# Patient Record
Sex: Female | Born: 1980 | Race: White | Hispanic: No | Marital: Single | Smoking: Never smoker
Health system: Southern US, Community
[De-identification: ages and names within clinical notes are randomized; demographics above are authoritative.]

## PROBLEM LIST (undated history)

## (undated) ENCOUNTER — Inpatient Hospital Stay (HOSPITAL_COMMUNITY): Payer: Self-pay

## (undated) DIAGNOSIS — Z789 Other specified health status: Secondary | ICD-10-CM

## (undated) HISTORY — PX: NO PAST SURGERIES: SHX2092

---

## 2010-11-18 ENCOUNTER — Emergency Department: Payer: Self-pay | Admitting: Emergency Medicine

## 2010-11-19 ENCOUNTER — Emergency Department: Payer: Self-pay | Admitting: *Deleted

## 2011-04-15 ENCOUNTER — Emergency Department: Payer: Self-pay | Admitting: Emergency Medicine

## 2011-04-21 ENCOUNTER — Emergency Department: Payer: Self-pay

## 2013-01-09 ENCOUNTER — Emergency Department: Payer: Self-pay | Admitting: Emergency Medicine

## 2013-01-09 LAB — BASIC METABOLIC PANEL
Anion Gap: 7 (ref 7–16)
Calcium, Total: 8.5 mg/dL (ref 8.5–10.1)
Co2: 25 mmol/L (ref 21–32)
EGFR (Non-African Amer.): >60
Osmolality: 272 (ref 275–301)
Potassium: 3.5 mmol/L (ref 3.5–5.1)

## 2013-01-09 LAB — CBC
HCT: 38.9 % (ref 35.0–47.0)
HGB: 12.8 g/dL (ref 12.0–16.0)
MCHC: 32.8 g/dL (ref 32.0–36.0)
MCV: 85 fL (ref 80–100)
Platelet: 374 10*3/uL (ref 150–440)
RDW: 15.5 % — ABNORMAL HIGH (ref 11.5–14.5)
WBC: 6.6 10*3/uL (ref 3.6–11.0)

## 2013-01-09 LAB — TROPONIN I: Troponin-I: <0.02 ng/mL

## 2013-08-27 ENCOUNTER — Emergency Department: Payer: Self-pay | Admitting: Emergency Medicine

## 2013-08-29 ENCOUNTER — Emergency Department: Payer: Self-pay | Admitting: Internal Medicine

## 2013-08-29 LAB — CBC WITH DIFFERENTIAL/PLATELET
BASOS ABS: 0 10*3/uL (ref 0.0–0.1)
BASOS PCT: 0.2 %
EOS PCT: 0.6 %
Eosinophil #: 0.1 10*3/uL (ref 0.0–0.7)
HCT: 42.9 % (ref 35.0–47.0)
HGB: 13.7 g/dL (ref 12.0–16.0)
LYMPHS ABS: 1 10*3/uL (ref 1.0–3.6)
Lymphocyte %: 8 %
MCH: 29.3 pg (ref 26.0–34.0)
MCHC: 31.9 g/dL — ABNORMAL LOW (ref 32.0–36.0)
MCV: 92 fL (ref 80–100)
Monocyte #: 0.6 x10 3/mm (ref 0.2–0.9)
Monocyte %: 4.7 %
NEUTROS PCT: 86.5 %
Neutrophil #: 10.6 10*3/uL — ABNORMAL HIGH (ref 1.4–6.5)
Platelet: 314 10*3/uL (ref 150–440)
RBC: 4.67 10*6/uL (ref 3.80–5.20)
RDW: 14.7 % — AB (ref 11.5–14.5)
WBC: 12.3 10*3/uL — ABNORMAL HIGH (ref 3.6–11.0)

## 2013-08-29 LAB — BASIC METABOLIC PANEL
Anion Gap: 6 — ABNORMAL LOW (ref 7–16)
BUN: 12 mg/dL (ref 7–18)
CO2: 27 mmol/L (ref 21–32)
Calcium, Total: 8.7 mg/dL (ref 8.5–10.1)
Chloride: 102 mmol/L (ref 98–107)
Creatinine: 1.06 mg/dL (ref 0.60–1.30)
EGFR (African American): >60
EGFR (Non-African Amer.): >60
Glucose: 126 mg/dL — ABNORMAL HIGH (ref 65–99)
OSMOLALITY: 271 (ref 275–301)
POTASSIUM: 4.2 mmol/L (ref 3.5–5.1)
Sodium: 135 mmol/L — ABNORMAL LOW (ref 136–145)

## 2013-09-07 ENCOUNTER — Emergency Department: Payer: Self-pay | Admitting: Emergency Medicine

## 2015-04-19 ENCOUNTER — Inpatient Hospital Stay (HOSPITAL_COMMUNITY)
Admission: AD | Admit: 2015-04-19 | Discharge: 2015-04-19 | Disposition: A | Discharge status: Home / Self Care | Payer: 59 | Source: Ambulatory Visit | Attending: Obstetrics & Gynecology | Admitting: Obstetrics & Gynecology

## 2015-04-19 ENCOUNTER — Encounter (HOSPITAL_COMMUNITY): Payer: Self-pay | Admitting: *Deleted

## 2015-04-19 ENCOUNTER — Inpatient Hospital Stay (HOSPITAL_COMMUNITY): Payer: 59

## 2015-04-19 DIAGNOSIS — O209 Hemorrhage in early pregnancy, unspecified: Secondary | ICD-10-CM | POA: Insufficient documentation

## 2015-04-19 DIAGNOSIS — O4691 Antepartum hemorrhage, unspecified, first trimester: Secondary | ICD-10-CM | POA: Diagnosis not present

## 2015-04-19 DIAGNOSIS — Z3A01 Less than 8 weeks gestation of pregnancy: Secondary | ICD-10-CM | POA: Insufficient documentation

## 2015-04-19 HISTORY — DX: Other specified health status: Z78.9

## 2015-04-19 LAB — URINALYSIS, ROUTINE W REFLEX MICROSCOPIC
BILIRUBIN URINE: NEGATIVE
GLUCOSE, UA: NEGATIVE mg/dL
KETONES UR: NEGATIVE mg/dL
Nitrite: NEGATIVE
PH: 6.5 (ref 5.0–8.0)
Protein, ur: NEGATIVE mg/dL
SPECIFIC GRAVITY, URINE: 1.01 (ref 1.005–1.030)

## 2015-04-19 LAB — WET PREP, GENITAL
Sperm: NONE SEEN
TRICH WET PREP: NONE SEEN
Yeast Wet Prep HPF POC: NONE SEEN

## 2015-04-19 LAB — CBC
HEMATOCRIT: 39.1 % (ref 36.0–46.0)
Hemoglobin: 13.6 g/dL (ref 12.0–15.0)
MCH: 30.8 pg (ref 26.0–34.0)
MCHC: 34.8 g/dL (ref 30.0–36.0)
MCV: 88.7 fL (ref 78.0–100.0)
Platelets: 332 10*3/uL (ref 150–400)
RBC: 4.41 MIL/uL (ref 3.87–5.11)
RDW: 13.1 % (ref 11.5–15.5)
WBC: 8.6 10*3/uL (ref 4.0–10.5)

## 2015-04-19 LAB — URINE MICROSCOPIC-ADD ON

## 2015-04-19 LAB — POCT PREGNANCY, URINE: Preg Test, Ur: POSITIVE — AB

## 2015-04-19 LAB — HCG, QUANTITATIVE, PREGNANCY: HCG, BETA CHAIN, QUANT, S: 19701 m[IU]/mL — AB (ref <5)

## 2015-04-19 LAB — ABO/RH: ABO/RH(D): O POS

## 2015-04-19 MED ORDER — METRONIDAZOLE 500 MG PO TABS: 500.0000 mg | ORAL_TABLET | Freq: Two times a day (BID) | ORAL | Status: DC

## 2015-04-19 NOTE — MAU Provider Note (Signed)
History     CSN: [id number]  Arrival date and time: 04/19/15 [id number]   First Provider Initiated Contact with Patient 04/19/15 1002      Chief Complaint  Patient presents with  . Vaginal Bleeding   HPI 35 y.o. G3P0020 at 3420w0d with spotting since last night, no pain. Last intercourse Tuesday night. Denies pain.   Past Medical History  Diagnosis Date  . Medical history non-contributory     Past Surgical History  Procedure Laterality Date  . No past surgeries      History reviewed. No pertinent family history.  Social History  Substance Use Topics  . Smoking status: Never Smoker   . Smokeless tobacco: None  . Alcohol Use: Yes    Allergies:  Allergies  Allergen Reactions  . Sulfa Antibiotics Hives    No prescriptions prior to admission    Review of Systems  Constitutional: Negative.   Respiratory: Negative.   Cardiovascular: Negative.   Gastrointestinal: Negative for nausea, vomiting, abdominal pain, diarrhea and constipation.  Genitourinary: Negative for dysuria, urgency, frequency, hematuria and flank pain.       + bleeding   Musculoskeletal: Negative.   Neurological: Negative.   Psychiatric/Behavioral: Negative.    Physical Exam   Blood pressure 99/80, pulse 79, temperature 98 F (36.7 C), temperature source Oral, resp. rate 16, height 5\' 6"  (1.676 m), weight 174 lb 6.4 oz (79.107 kg), last menstrual period 03/01/2015.  Physical Exam  Constitutional: She is oriented to person, place, and time. She appears well-developed and well-nourished. No distress.  HENT:  Head: Normocephalic and atraumatic.  Cardiovascular: Normal rate, regular rhythm and normal heart sounds.   Respiratory: Effort normal and breath sounds normal. No respiratory distress.  GI: Soft. Bowel sounds are normal. She exhibits no distension and no mass. There is no tenderness. There is no rebound and no guarding.  Genitourinary: There is no rash or lesion on the right labia. There is no  rash or lesion on the left labia. Uterus is not deviated, not enlarged, not fixed and not tender. Cervix exhibits no motion tenderness, no discharge and no friability. Right adnexum displays no mass, no tenderness and no fullness. Left adnexum displays no mass, no tenderness and no fullness. No erythema, tenderness or bleeding in the vagina. Vaginal discharge (brown) found.  Neurological: She is alert and oriented to person, place, and time.  Skin: Skin is warm and dry.  Psychiatric: She has a normal mood and affect.    MAU Course  Procedures Results for orders placed or performed during the hospital encounter of 04/19/15 (from the past 24 hour(s))  Urinalysis, Routine w reflex microscopic (not [facility])     Status: Abnormal   Collection Time: 04/19/15  9:20 AM  Result Value Ref Range   Color, Urine YELLOW YELLOW   APPearance CLEAR CLEAR   Specific Gravity, Urine 1.010 1.005 - 1.030   pH 6.5 5.0 - 8.0   Glucose, UA NEGATIVE NEGATIVE mg/dL   Hgb urine dipstick LARGE (A) NEGATIVE   Bilirubin Urine NEGATIVE NEGATIVE   Ketones, ur NEGATIVE NEGATIVE mg/dL   Protein, ur NEGATIVE NEGATIVE mg/dL   Nitrite NEGATIVE NEGATIVE   Leukocytes, UA SMALL (A) NEGATIVE  Urine microscopic-add on     Status: Abnormal   Collection Time: 04/19/15  9:20 AM  Result Value Ref Range   Squamous Epithelial / LPF 6-30 (A) NONE SEEN   WBC, UA 6-30 0 - 5 WBC/hpf   RBC / HPF 0-5 0 - 5  RBC/hpf   Bacteria, UA FEW (A) NONE SEEN  Pregnancy, urine POC     Status: Abnormal   Collection Time: 04/19/15  9:37 AM  Result Value Ref Range   Preg Test, Ur POSITIVE (A) NEGATIVE  CBC     Status: None   Collection Time: 04/19/15  9:45 AM  Result Value Ref Range   WBC 8.6 4.0 - 10.5 K/uL   RBC 4.41 3.87 - 5.11 MIL/uL   Hemoglobin 13.6 12.0 - 15.0 g/dL   HCT 16.1 09.6 - 04.5 %   MCV 88.7 78.0 - 100.0 fL   MCH 30.8 26.0 - 34.0 pg   MCHC 34.8 30.0 - 36.0 g/dL   RDW 40.9 81.1 - 91.4 %   Platelets 332 150 - 400 K/uL  hCG,  quantitative, pregnancy     Status: Abnormal   Collection Time: 04/19/15  9:54 AM  Result Value Ref Range   hCG, Beta Chain, Quant, S 19701 (H) <5 mIU/mL  ABO/Rh     Status: None (Preliminary result)   Collection Time: 04/19/15  9:54 AM  Result Value Ref Range   ABO/RH(D) O POS   Wet prep, genital     Status: Abnormal   Collection Time: 04/19/15 10:25 AM  Result Value Ref Range   Yeast Wet Prep HPF POC NONE SEEN NONE SEEN   Trich, Wet Prep NONE SEEN NONE SEEN   Clue Cells Wet Prep HPF POC PRESENT (A) NONE SEEN   WBC, Wet Prep HPF POC FEW (A) NONE SEEN   Sperm NONE SEEN     US Ob Comp Less 14 Wks  04/19/2015  CLINICAL DATA:  35 year old pregnant female with vaginal spotting since 3 a.m. EXAM: OBSTETRIC <14 WK [place] AND TRANSVAGINAL OB US TECHNIQUE: Both transabdominal and transvaginal ultrasound examinations were performed for complete evaluation of the gestation as well as the maternal uterus, adnexal regions, and pelvic cul-de-sac. Transvaginal technique was performed to assess early pregnancy. COMPARISON:  No priors. FINDINGS: Intrauterine gestational sac: Single ovoid shaped gestational sac in the fundal portion of the endometrial canal. Yolk sac:  Present. Embryo:  None. Cardiac Activity: None. Heart Rate: N/A MSD: 12  mm   6 w   0  d      [place] EDC: 12/13/2015 Subchorionic hemorrhage: No definite subchorionic hemorrhage. However, there does appear to be a moderate volume of complex fluid in the endometrial canal extending from the gestational sac to the lower uterine segment, concerning for blood. Maternal uterus/adnexae: Bilateral ovaries are normal in appearance. Trace volume of free fluid in the cul-de-sac. IMPRESSION: 1. Probable early IUP with yolk sac, but no fetal pole, or cardiac activity yet visualized. Recommend follow-up quantitative B-HCG levels and follow-up US in 14 days to confirm and assess viability. This recommendation follows SRU consensus guidelines: Diagnostic Criteria for  Nonviable Pregnancy Early in the First Trimester. Malva Limes Med 2013; 782:9562-13. 2. Moderate volume of complex fluid in the endometrial canal presumably reflects blood products. The appearance of this is not typical for subchorionic hemorrhage. 3. Trace volume of free fluid in the cul-de-sac, presumably physiologic. Electronically Signed   By: Trudie Reed M.D.   On: 04/19/2015 11:46   US Ob Transvaginal  04/19/2015  CLINICAL DATA:  35 year old pregnant female with vaginal spotting since 3 a.m. EXAM: OBSTETRIC <14 WK [place] AND TRANSVAGINAL OB US TECHNIQUE: Both transabdominal and transvaginal ultrasound examinations were performed for complete evaluation of the gestation as well as the maternal uterus, adnexal regions, and pelvic  cul-de-sac. Transvaginal technique was performed to assess early pregnancy. COMPARISON:  No priors. FINDINGS: Intrauterine gestational sac: Single ovoid shaped gestational sac in the fundal portion of the endometrial canal. Yolk sac:  Present. Embryo:  None. Cardiac Activity: None. Heart Rate: N/A MSD: 12  mm   6 w   0  d      [place] EDC: 12/13/2015 Subchorionic hemorrhage: No definite subchorionic hemorrhage. However, there does appear to be a moderate volume of complex fluid in the endometrial canal extending from the gestational sac to the lower uterine segment, concerning for blood. Maternal uterus/adnexae: Bilateral ovaries are normal in appearance. Trace volume of free fluid in the cul-de-sac. IMPRESSION: 1. Probable early IUP with yolk sac, but no fetal pole, or cardiac activity yet visualized. Recommend follow-up quantitative B-HCG levels and follow-up US in 14 days to confirm and assess viability. This recommendation follows SRU consensus guidelines: Diagnostic Criteria for Nonviable Pregnancy Early in the First Trimester. Malva Limes Med 2013; 161:0960-45. 2. Moderate volume of complex fluid in the endometrial canal presumably reflects blood products. The appearance of this is not  typical for subchorionic hemorrhage. 3. Trace volume of free fluid in the cul-de-sac, presumably physiologic. Electronically Signed   By: Trudie Reed M.D.   On: 04/19/2015 11:46    Assessment and Plan  35 y.o. G3P0020 at [redacted]w[redacted]d w/ spotting, IUGS w/ yolk sac measuring 6 weeks, likely blood in endometrial canal. Discussed threatened AB w/ patient, plan for repeat quant HCG in 48 hours in [facility] clinic, precautions rev'd. Flagyl for BV.     Medication List    TAKE these medications        metroNIDAZOLE 500 MG tablet  Commonly known as:  FLAGYL  Take 1 tablet (500 mg total) by mouth 2 (two) times daily.            Follow-up Information    Follow up with [facility] On 04/21/2015.   Specialty:  Obstetrics and Gynecology   Why:  11:00 AM for repeat labs   Contact information:   [address] [place] [address] [phone]        Shianna Bally 04/19/2015, 1:59 PM

## 2015-04-19 NOTE — Discharge Instructions (Signed)
Pelvic Rest °Pelvic rest is sometimes recommended for women when:  °· The placenta is partially or completely covering the opening of the cervix (placenta previa). °· There is bleeding between the uterine wall and the amniotic sac in the first trimester (subchorionic hemorrhage). °· The cervix begins to open without labor starting (incompetent cervix, cervical insufficiency). °· The labor is too early (preterm labor). °HOME CARE INSTRUCTIONS °· Do not have sexual intercourse, stimulation, or an orgasm. °· Do not use tampons, douche, or put anything in the vagina. °· Do not lift anything over 10 pounds (4.5 kg). °· Avoid strenuous activity or straining your pelvic muscles. °SEEK MEDICAL CARE IF:  °· You have any vaginal bleeding during pregnancy. Treat this as a potential emergency. °· You have cramping pain felt low in the stomach (stronger than menstrual cramps). °· You notice vaginal discharge (watery, mucus, or bloody). °· You have a low, dull backache. °· There are regular contractions or uterine tightening. °SEEK IMMEDIATE MEDICAL CARE IF: °You have vaginal bleeding and have placenta previa.  °  °This information is not intended to replace advice given to you by your health care provider. Make sure you discuss any questions you have with your health care provider. °  °Document Released: 05/22/2010 Document Revised: 04/19/2011 Document Reviewed: 07/29/2014 °Elsevier Interactive Patient Education ©2016 Elsevier Inc. ° °Vaginal Bleeding During Pregnancy, First Trimester °A small amount of bleeding (spotting) from the vagina is common in early pregnancy. Sometimes the bleeding is normal and is not a problem, and sometimes it is a sign of something serious. Be sure to tell your doctor about any bleeding from your vagina right away. °HOME CARE °· Watch your condition for any changes. °· Follow your doctor's instructions about how active you can be. °· If you are on bed rest: °¨ You may need to stay in bed and only  get up to use the bathroom. °¨ You may be allowed to do some activities. °¨ If you need help, make plans for someone to help you. °· Write down: °¨ The number of pads you use each day. °¨ How often you change pads. °¨ How soaked (saturated) your pads are. °· Do not use tampons. °· Do not douche. °· Do not have sex or orgasms until your doctor says it is okay. °· If you pass any tissue from your vagina, save the tissue so you can show it to your doctor. °· Only take medicines as told by your doctor. °· Do not take aspirin because it can make you bleed. °· Keep all follow-up visits as told by your doctor. °GET HELP IF:  °· You bleed from your vagina. °· You have cramps. °· You have labor pains. °· You have a fever that does not go away after you take medicine. °GET HELP RIGHT AWAY IF:  °· You have very bad cramps in your back or belly (abdomen). °· You pass large clots or tissue from your vagina. °· You bleed more. °· You feel light-headed or weak. °· You pass out (faint). °· You have chills. °· You are leaking fluid or have a gush of fluid from your vagina. °· You pass out while pooping (having a bowel movement). °MAKE SURE YOU: °· Understand these instructions. °· Will watch your condition. °· Will get help right away if you are not doing well or get worse. °  °This information is not intended to replace advice given to you by your health care provider. Make sure you discuss any questions   you have with your health care provider. °  °Document Released: 06/11/2013 Document Reviewed: 06/11/2013 °Elsevier Interactive Patient Education ©2016 Elsevier Inc. ° °

## 2015-04-19 NOTE — Progress Notes (Signed)
History   CSN: [id number]  Arrival date and time: 04/19/15 4098  First Provider Initiated Contact with Patient 04/19/15 1002     Chief Complaint  Patient presents with  . Vaginal Bleeding   HPI  OB History    Gravida Para Term Preterm AB TAB SAB Ectopic Multiple Living   Past Medical History  Diagnosis Date  . Medical history non-contributory     Past Surgical History  Procedure Laterality Date  . No past surgeries      History reviewed. No pertinent family history.  Social History  Substance Use Topics  . Smoking status: Never Smoker   . Smokeless tobacco: None  . Alcohol Use: Yes    Allergies:  Allergies  Allergen Reactions  . Sulfa Antibiotics Hives    No prescriptions prior to admission    Review of Systems  Constitutional: Negative for fever, chills and malaise/fatigue.  HENT: Negative for congestion, nosebleeds and sore throat.   Eyes: Negative for blurred vision and double vision.  Respiratory: Negative for cough, shortness of breath and wheezing.   Cardiovascular: Negative for chest pain, palpitations and leg swelling.  Gastrointestinal: Negative for heartburn, nausea, vomiting, abdominal pain, diarrhea, constipation and blood in stool.  Genitourinary: Positive for hematuria. Negative for dysuria, urgency, frequency and flank pain.  Musculoskeletal: Negative for myalgias, back pain and joint pain.  Skin: Negative for itching and rash.  Neurological: Negative for dizziness, tingling, seizures, loss of consciousness, weakness and headaches.  Endo/Heme/Allergies: Does not bruise/bleed easily.   Physical Exam   Blood pressure 121/66, pulse 84, temperature 98 F (36.7 C), temperature source Oral, resp. rate 16, height  (1.676 m), weight 79.107 kg (174 lb 6.4 oz), last menstrual period 03/01/2015.  Physical Exam  Constitutional: She is oriented to person, place, and time. She appears well-developed and well-nourished. No  distress.  HENT:  Head: Normocephalic and atraumatic.  Neck: Normal range of motion. Neck supple. No thyromegaly present.  Cardiovascular: Normal rate, regular rhythm, normal heart sounds and intact distal pulses.  Exam reveals no gallop and no friction rub.   No murmur heard. Respiratory: Effort normal and breath sounds normal. No respiratory distress. She has no wheezes. She has no rales. She exhibits no tenderness.  GI: Soft. Bowel sounds are normal. There is no tenderness. There is no rebound and no guarding.  Genitourinary: Pelvic exam was performed with patient supine. There is no rash, tenderness, lesion or injury on the right labia. There is no rash, tenderness, lesion or injury on the left labia. Uterus is not tender. Cervix exhibits discharge. Cervix exhibits no friability. Right adnexum displays no mass, no tenderness and no fullness. Left adnexum displays no mass, no tenderness and no fullness. No erythema, tenderness or bleeding in the vagina. No foreign body around the vagina. No signs of injury around the vagina. Vaginal discharge found.    Musculoskeletal: Normal range of motion. She exhibits no edema.  Lymphadenopathy:    She has no cervical adenopathy.  Neurological: She is alert and oriented to person, place, and time.  Skin: Skin is warm. No rash noted. No erythema. No pallor.  Psychiatric: She has a normal mood and affect. Her behavior is normal.    MAU Course  Procedures Overview of pelvic exam procedure explained to patient using teach-back. Patient voiced understanding. Nursing and provider present at bedside for exam. Patient gowned and draped appropriately. Patient placed in  lithotomy position. Using washed and gloved hands, external genitalia were examined. Using a lubricated speculum, the cervix and cervical os were visualized. GC/Chlamydia and vaginal wet prep collected. Speculum removed and disposed of. Bimanual exam was then completed using lubricated fingers.  Patient was then cleaned and wiped appropriately and returned to sitting position. Patient was then provided the opportunity to give feedback on exam technique to provider.  Assessment and Plan  1st Trimester Vaginal Bleeding 1. Urinalysis  2. STD panel, including HIV; discuss results with patient when they become available tomorrow 3. Ultrasound to assess for fetal health and position 4. Work with patient to establish care at local OB/GYN. Patient comments she prefers [facility]. 5. Review signs and symptoms of emergent conditions during pregnancy with patient using teach-back. 6. F/U with OB/GYN 7. Call or visit with questions or concerns in the interim.   Cephus Shellingndrew P Betsey Sossamon 04/19/2015, 10:29 AM

## 2015-04-19 NOTE — MAU Note (Signed)
Pt states the spotting started today around 0300.  Pt states there was a spot in her pants when she got up around 0800.  Pt states is was a similar amount of blood as when you first start you period.  Pt denies pain or cramping.

## 2015-04-20 LAB — HIV ANTIBODY (ROUTINE TESTING W REFLEX): HIV SCREEN 4TH GENERATION: NONREACTIVE

## 2015-04-21 ENCOUNTER — Ambulatory Visit (INDEPENDENT_AMBULATORY_CARE_PROVIDER_SITE_OTHER): Payer: 59 | Admitting: Obstetrics & Gynecology

## 2015-04-21 ENCOUNTER — Telehealth: Payer: Self-pay | Admitting: *Deleted

## 2015-04-21 DIAGNOSIS — O418X10 Other specified disorders of amniotic fluid and membranes, first trimester, not applicable or unspecified: Secondary | ICD-10-CM

## 2015-04-21 DIAGNOSIS — O468X1 Other antepartum hemorrhage, first trimester: Secondary | ICD-10-CM | POA: Diagnosis not present

## 2015-04-21 LAB — GC/CHLAMYDIA PROBE AMP (~~LOC~~) NOT AT ARMC
Chlamydia: NEGATIVE
NEISSERIA GONORRHEA: NEGATIVE

## 2015-04-21 LAB — HCG, QUANTITATIVE, PREGNANCY: HCG, BETA CHAIN, QUANT, S: 34494 m[IU]/mL — AB (ref <5)

## 2015-04-21 NOTE — Progress Notes (Signed)
Ultrasounds Results Note Cc: seen in MAU for spotting SUBJECTIVE HPI:  Ms. Andrea Aguilar is a 35 y.o. G3P0020 at 5440w2d by LMP who presents to the [facility] for followup ultrasound results. The patient denies abdominal pain or vaginal bleeding.  Upon review of the patient's records, patient was first seen in MAU on 3/11 for spotting.   BHCG on that day was approx 19000.  Ultrasound showed IUGS and and on review of images yolk sac is visible but not noted in the report.   Past Medical History  Diagnosis Date  . Medical history non-contributory    Past Surgical History  Procedure Laterality Date  . No past surgeries     Social History   Social History  . Marital Status: Single    Spouse Name: N/A  . Number of Children: N/A  . Years of Education: N/A   Occupational History  . Not on file.   Social History Main Topics  . Smoking status: Never Smoker   . Smokeless tobacco: Not on file  . Alcohol Use: Yes  . Drug Use: No  . Sexual Activity: Not on file   Other Topics Concern  . Not on file   Social History Narrative  . No narrative on file   Current Outpatient Prescriptions on File Prior to Visit  Medication Sig Dispense Refill  . metroNIDAZOLE (FLAGYL) 500 MG tablet Take 1 tablet (500 mg total) by mouth 2 (two) times daily. 14 tablet 0   No current facility-administered medications on file prior to visit.   Allergies  Allergen Reactions  . Sulfa Antibiotics Hives    I have reviewed patient's Past Medical Hx, Surgical Hx, Family Hx, Social Hx, medications and allergies.   Review of Systems  Genitourinary: Negative for vaginal bleeding (bleeding has stopped), vaginal discharge, menstrual problem and pelvic pain.   Review of Systems  Constitutional: Negative for fever and chills.  Gastrointestinal: Negative for nausea, vomiting, abdominal pain, diarrhea and constipation.  Genitourinary: Negative for dysuria.  Musculoskeletal: Negative for back pain.   Neurological: Negative for dizziness and weakness.    Physical Exam  LMP 03/01/2015  GENERAL: Well-developed, well-nourished female in no acute distress.  HEENT: Normocephalic, atraumatic.   LUNGS: Effort normal ABDOMEN: soft, non-tender HEART: Regular rate  SKIN: Warm, dry and without erythema PSYCH: Normal mood and affect NEURO: Alert and oriented x 4  LAB RESULTS No results found for this or any previous visit (from the past 24 hour(s)).  IMAGING [place] Ob Comp Less 14 Wks  04/19/2015  CLINICAL DATA:  35 year old pregnant female with vaginal spotting since 3 a.m. EXAM: OBSTETRIC <14 WK US AND TRANSVAGINAL OB US TECHNIQUE: Both transabdominal and transvaginal ultrasound examinations were performed for complete evaluation of the gestation as well as the maternal uterus, adnexal regions, and pelvic cul-de-sac. Transvaginal technique was performed to assess early pregnancy. COMPARISON:  No priors. FINDINGS: Intrauterine gestational sac: Single ovoid shaped gestational sac in the fundal portion of the endometrial canal. Yolk sac:  Present. Embryo:  None. Cardiac Activity: None. Heart Rate: N/A MSD: 12  mm   6 w   0  d      US EDC: 12/13/2015 Subchorionic hemorrhage: No definite subchorionic hemorrhage. However, there does appear to be a moderate volume of complex fluid in the endometrial canal extending from the gestational sac to the lower uterine segment, concerning for blood. Maternal uterus/adnexae: Bilateral ovaries are normal in appearance. Trace volume of free fluid in the cul-de-sac. IMPRESSION: 1. Probable  early IUP with yolk sac, but no fetal pole, or cardiac activity yet visualized. Recommend follow-up quantitative B-HCG levels and follow-up US in 14 days to confirm and assess viability. This recommendation follows SRU consensus guidelines: Diagnostic Criteria for Nonviable Pregnancy Early in the First Trimester. Malva Limes Med 2013; 119:1478-29. 2. Moderate volume of complex fluid in the  endometrial canal presumably reflects blood products. The appearance of this is not typical for subchorionic hemorrhage. 3. Trace volume of free fluid in the cul-de-sac, presumably physiologic. Electronically Signed   By: Trudie Reed M.D.   On: 04/19/2015 11:46   US Ob Transvaginal  04/19/2015  CLINICAL DATA:  35 year old pregnant female with vaginal spotting since 3 a.m. EXAM: OBSTETRIC <14 WK [place] AND TRANSVAGINAL OB US TECHNIQUE: Both transabdominal and transvaginal ultrasound examinations were performed for complete evaluation of the gestation as well as the maternal uterus, adnexal regions, and pelvic cul-de-sac. Transvaginal technique was performed to assess early pregnancy. COMPARISON:  No priors. FINDINGS: Intrauterine gestational sac: Single ovoid shaped gestational sac in the fundal portion of the endometrial canal. Yolk sac:  Present. Embryo:  None. Cardiac Activity: None. Heart Rate: N/A MSD: 12  mm   6 w   0  d      [place] EDC: 12/13/2015 Subchorionic hemorrhage: No definite subchorionic hemorrhage. However, there does appear to be a moderate volume of complex fluid in the endometrial canal extending from the gestational sac to the lower uterine segment, concerning for blood. Maternal uterus/adnexae: Bilateral ovaries are normal in appearance. Trace volume of free fluid in the cul-de-sac. IMPRESSION: 1. Probable early IUP with yolk sac, but no fetal pole, or cardiac activity yet visualized. Recommend follow-up quantitative B-HCG levels and follow-up US in 14 days to confirm and assess viability. This recommendation follows SRU consensus guidelines: Diagnostic Criteria for Nonviable Pregnancy Early in the First Trimester. Malva Limes Med 2013; 562:1308-65. 2. Moderate volume of complex fluid in the endometrial canal presumably reflects blood products. The appearance of this is not typical for subchorionic hemorrhage. 3. Trace volume of free fluid in the cul-de-sac, presumably physiologic. Electronically  Signed   By: Trudie Reed M.D.   On: 04/19/2015 11:46   I reviewed the images and a yolk sac is visible making a clear diagnosis of 5-6 week IUP Quant HCG sent today is pending ASSESSMENT 1. Subchorionic bleed, first trimester     PLAN Discharge home in stable condition Patient advised to start/continue taking prenatal vitamins We will notify her of HCG result  Patient advised to start prenatal care with [facility] provider of choice as soon as possible. She has appt in [place] in 2 weeks but has moved to [facility] [place]  04/21/2015  1:35 PM

## 2015-04-21 NOTE — Patient Instructions (Signed)
First Trimester of Pregnancy The first trimester of pregnancy is from week 1 until the end of week 12 (months 1 through 3). A week after a sperm fertilizes an egg, the egg will implant on the wall of the uterus. This embryo will begin to develop into a baby. Genes from you and your partner are forming the baby. The female genes determine whether the baby is a boy or a girl. At 6-8 weeks, the eyes and face are formed, and the heartbeat can be seen on ultrasound. At the end of 12 weeks, all the baby's organs are formed.  Now that you are pregnant, you will want to do everything you can to have a healthy baby. Two of the most important things are to get good prenatal care and to follow your health care provider's instructions. Prenatal care is all the medical care you receive before the baby's birth. This care will help prevent, find, and treat any problems during the pregnancy and childbirth. BODY CHANGES Your body goes through many changes during pregnancy. The changes vary from woman to woman.   You may gain or lose a couple of pounds at first.  You may feel sick to your stomach (nauseous) and throw up (vomit). If the vomiting is uncontrollable, call your health care provider.  You may tire easily.  You may develop headaches that can be relieved by medicines approved by your health care provider.  You may urinate more often. Painful urination may mean you have a bladder infection.  You may develop heartburn as a result of your pregnancy.  You may develop constipation because certain hormones are causing the muscles that push waste through your intestines to slow down.  You may develop hemorrhoids or swollen, bulging veins (varicose veins).  Your breasts may begin to grow larger and become tender. Your nipples may stick out more, and the tissue that surrounds them (areola) may become darker.  Your gums may bleed and may be sensitive to brushing and flossing.  Dark spots or blotches (chloasma,  mask of pregnancy) may develop on your face. This will likely fade after the baby is born.  Your menstrual periods will stop.  You may have a loss of appetite.  You may develop cravings for certain kinds of food.  You may have changes in your emotions from day to day, such as being excited to be pregnant or being concerned that something may go wrong with the pregnancy and baby.  You may have more vivid and strange dreams.  You may have changes in your hair. These can include thickening of your hair, rapid growth, and changes in texture. Some women also have hair loss during or after pregnancy, or hair that feels dry or thin. Your hair will most likely return to normal after your baby is born. WHAT TO EXPECT AT YOUR PRENATAL VISITS During a routine prenatal visit:  You will be weighed to make sure you and the baby are growing normally.  Your blood pressure will be taken.  Your abdomen will be measured to track your baby's growth.  The fetal heartbeat will be listened to starting around week 10 or 12 of your pregnancy.  Test results from any previous visits will be discussed. Your health care provider may ask you:  How you are feeling.  If you are feeling the baby move.  If you have had any abnormal symptoms, such as leaking fluid, bleeding, severe headaches, or abdominal cramping.  If you are using any tobacco products,   including cigarettes, chewing tobacco, and electronic cigarettes.  If you have any questions. Other tests that may be performed during your first trimester include:  Blood tests to find your blood type and to check for the presence of any previous infections. They will also be used to check for low iron levels (anemia) and Rh antibodies. Later in the pregnancy, blood tests for diabetes will be done along with other tests if problems develop.  Urine tests to check for infections, diabetes, or protein in the urine.  An ultrasound to confirm the proper growth  and development of the baby.  An amniocentesis to check for possible genetic problems.  Fetal screens for spina bifida and Down syndrome.  You may need other tests to make sure you and the baby are doing well.  HIV (human immunodeficiency virus) testing. Routine prenatal testing includes screening for HIV, unless you choose not to have this test. HOME CARE INSTRUCTIONS  Medicines  Follow your health care provider's instructions regarding medicine use. Specific medicines may be either safe or unsafe to take during pregnancy.  Take your prenatal vitamins as directed.  If you develop constipation, try taking a stool softener if your health care provider approves. Diet  Eat regular, well-balanced meals. Choose a variety of foods, such as meat or vegetable-based protein, fish, milk and low-fat dairy products, vegetables, fruits, and whole grain breads and cereals. Your health care provider will help you determine the amount of weight gain that is right for you.  Avoid raw meat and uncooked cheese. These carry germs that can cause birth defects in the baby.  Eating four or five small meals rather than three large meals a day may help relieve nausea and vomiting. If you start to feel nauseous, eating a few soda crackers can be helpful. Drinking liquids between meals instead of during meals also seems to help nausea and vomiting.  If you develop constipation, eat more high-fiber foods, such as fresh vegetables or fruit and whole grains. Drink enough fluids to keep your urine clear or pale yellow. Activity and Exercise  Exercise only as directed by your health care provider. Exercising will help you:  Control your weight.  Stay in shape.  Be prepared for labor and delivery.  Experiencing pain or cramping in the lower abdomen or low back is a good sign that you should stop exercising. Check with your health care provider before continuing normal exercises.  Try to avoid standing for long  periods of time. Move your legs often if you must stand in one place for a long time.  Avoid heavy lifting.  Wear low-heeled shoes, and practice good posture.  You may continue to have sex unless your health care provider directs you otherwise. Relief of Pain or Discomfort  Wear a good support bra for breast tenderness.   Take warm sitz baths to soothe any pain or discomfort caused by hemorrhoids. Use hemorrhoid cream if your health care provider approves.   Rest with your legs elevated if you have leg cramps or low back pain.  If you develop varicose veins in your legs, wear support hose. Elevate your feet for 15 minutes, 3-4 times a day. Limit salt in your diet. Prenatal Care  Schedule your prenatal visits by the twelfth week of pregnancy. They are usually scheduled monthly at first, then more often in the last 2 months before delivery.  Write down your questions. Take them to your prenatal visits.  Keep all your prenatal visits as directed by your   health care provider. Safety  Wear your seat belt at all times when driving.  Make a list of emergency phone numbers, including numbers for family, friends, the hospital, and police and fire departments. General Tips  Ask your health care provider for a referral to a local prenatal education class. Begin classes no later than at the beginning of month 6 of your pregnancy.  Ask for help if you have counseling or nutritional needs during pregnancy. Your health care provider can offer advice or refer you to specialists for help with various needs.  Do not use hot tubs, steam rooms, or saunas.  Do not douche or use tampons or scented sanitary pads.  Do not cross your legs for long periods of time.  Avoid cat litter boxes and soil used by cats. These carry germs that can cause birth defects in the baby and possibly loss of the fetus by miscarriage or stillbirth.  Avoid all smoking, herbs, alcohol, and medicines not prescribed by  your health care provider. Chemicals in these affect the formation and growth of the baby.  Do not use any tobacco products, including cigarettes, chewing tobacco, and electronic cigarettes. If you need help quitting, ask your health care provider. You may receive counseling support and other resources to help you quit.  Schedule a dentist appointment. At home, brush your teeth with a soft toothbrush and be gentle when you floss. SEEK MEDICAL CARE IF:   You have dizziness.  You have mild pelvic cramps, pelvic pressure, or nagging pain in the abdominal area.  You have persistent nausea, vomiting, or diarrhea.  You have a bad smelling vaginal discharge.  You have pain with urination.  You notice increased swelling in your face, hands, legs, or ankles. SEEK IMMEDIATE MEDICAL CARE IF:   You have a fever.  You are leaking fluid from your vagina.  You have spotting or bleeding from your vagina.  You have severe abdominal cramping or pain.  You have rapid weight gain or loss.  You vomit blood or material that looks like coffee grounds.  You are exposed to German measles and have never had them.  You are exposed to fifth disease or chickenpox.  You develop a severe headache.  You have shortness of breath.  You have any kind of trauma, such as from a fall or a car accident.   This information is not intended to replace advice given to you by your health care provider. Make sure you discuss any questions you have with your health care provider.   Document Released: 01/19/2001 Document Revised: 02/15/2014 Document Reviewed: 12/05/2012 Elsevier Interactive Patient Education 2016 Else[employer] Inc.  

## 2015-04-21 NOTE — Telephone Encounter (Signed)
Called Andrea Aguilar with results of her bhcg today after review by Dr. Debroah LoopArnold.Informed her level has risen well and is good results.  Instructed her to keep her New ob appt she has already scheduled with provider in [place]. She voices understanding.

## 2015-04-23 ENCOUNTER — Inpatient Hospital Stay (HOSPITAL_COMMUNITY): Payer: 59

## 2015-04-23 ENCOUNTER — Encounter (HOSPITAL_COMMUNITY): Payer: Self-pay | Admitting: *Deleted

## 2015-04-23 ENCOUNTER — Inpatient Hospital Stay (HOSPITAL_COMMUNITY)
Admission: AD | Admit: 2015-04-23 | Discharge: 2015-04-23 | Disposition: A | Discharge status: Home / Self Care | Payer: 59 | Source: Ambulatory Visit | Attending: Obstetrics & Gynecology | Admitting: Obstetrics & Gynecology

## 2015-04-23 DIAGNOSIS — O208 Other hemorrhage in early pregnancy: Secondary | ICD-10-CM | POA: Insufficient documentation

## 2015-04-23 DIAGNOSIS — O468X1 Other antepartum hemorrhage, first trimester: Secondary | ICD-10-CM

## 2015-04-23 DIAGNOSIS — R109 Unspecified abdominal pain: Secondary | ICD-10-CM | POA: Diagnosis present

## 2015-04-23 DIAGNOSIS — O26851 Spotting complicating pregnancy, first trimester: Secondary | ICD-10-CM

## 2015-04-23 DIAGNOSIS — Z3A01 Less than 8 weeks gestation of pregnancy: Secondary | ICD-10-CM | POA: Insufficient documentation

## 2015-04-23 DIAGNOSIS — O418X10 Other specified disorders of amniotic fluid and membranes, first trimester, not applicable or unspecified: Secondary | ICD-10-CM

## 2015-04-23 NOTE — Discharge Instructions (Signed)
Pelvic Rest °Pelvic rest is sometimes recommended for women when:  °· The placenta is partially or completely covering the opening of the cervix (placenta previa). °· There is bleeding between the uterine wall and the amniotic sac in the first trimester (subchorionic hemorrhage). °· The cervix begins to open without labor starting (incompetent cervix, cervical insufficiency). °· The labor is too early (preterm labor). °HOME CARE INSTRUCTIONS °· Do not have sexual intercourse, stimulation, or an orgasm. °· Do not use tampons, douche, or put anything in the vagina. °· Do not lift anything over 10 pounds (4.5 kg). °· Avoid strenuous activity or straining your pelvic muscles. °SEEK MEDICAL CARE IF:  °· You have any vaginal bleeding during pregnancy. Treat this as a potential emergency. °· You have cramping pain felt low in the stomach (stronger than menstrual cramps). °· You notice vaginal discharge (watery, mucus, or bloody). °· You have a low, dull backache. °· There are regular contractions or uterine tightening. °SEEK IMMEDIATE MEDICAL CARE IF: °You have vaginal bleeding and have placenta previa.  °  °This information is not intended to replace advice given to you by your health care provider. Make sure you discuss any questions you have with your health care provider. °  °Document Released: 05/22/2010 Document Revised: 04/19/2011 Document Reviewed: 07/29/2014 °Elsevier Interactive Patient Education ©2016 [employer] Inc[employer] °Subchorionic Hematoma °A subchorionic hematoma is a gathering of blood between the outer wall of the placenta and the inner wall of the womb (uterus). The placenta is the organ that connects the fetus to the wall of the uterus. The placenta performs the feeding, breathing (oxygen to the fetus), and waste removal (excretory work) of the fetus.  °Subchorionic hematoma is the most common abnormality found on a result from ultrasonography done during the first trimester or early second trimester of  pregnancy. If there has been little or no vaginal bleeding, early small hematomas usually shrink on their own and do not affect your baby or pregnancy. The blood is gradually absorbed over 1-2 weeks. When bleeding starts later in pregnancy or the hematoma is larger or occurs in an older pregnant woman, the outcome may not be as good. Larger hematomas may get bigger, which increases the chances for miscarriage. Subchorionic hematoma also increases the risk of premature detachment of the placenta from the uterus, preterm (premature) labor, and stillbirth. °HOME CARE INSTRUCTIONS °· Stay on bed rest if your health care provider recommends this. Although bed rest will not prevent more bleeding or prevent a miscarriage, your health care provider may recommend bed rest until you are advised otherwise. °· Avoid heavy lifting (more than 10 lb [4.5 kg]), exercise, sexual intercourse, or douching as directed by your health care provider. °· Keep track of the number of pads you use each day and how soaked (saturated) they are. Write down this information. °· Do not use tampons. °· Keep all follow-up appointments as directed by your health care provider. Your health care provider may ask you to have follow-up blood tests or ultrasound tests or both. °SEEK IMMEDIATE MEDICAL CARE IF: °· You have severe cramps in your stomach, back, abdomen, or pelvis. °· You have a fever. °· You pass large clots or tissue. Save any tissue for your health care provider to look at. °· Your bleeding increases or you become lightheaded, feel weak, or have fainting episodes. °  °This information is not intended to replace advice given to you by your health care provider. Make sure you discuss any questions you have with   your health care provider. °  °Document Released: 05/12/2006 Document Revised: 02/15/2014 Document Reviewed: 08/24/2012 °Elsevier Interactive Patient Education ©2016 Elsevier Inc. ° °

## 2015-04-23 NOTE — MAU Provider Note (Signed)
History     CSN: [id number]  Arrival date and time: 04/23/15 0422   First Provider Initiated Contact with Patient 04/23/15 [phone]      Chief Complaint  Patient presents with  . Abdominal Cramping   HPI Ms. Andrea Aguilar is a 35 y.o. G3P0020 at [redacted]w[redacted]d who presents to MAU today with complaint of spotting. The patient was seen in MAU on 3/10 with the same complaint. [place] at that time showed IUGS and YS without FP or cardiac activity. Patient states that tonight while at work she was more active and noted a slight increased in spotting. She still has needed no more than a panty liner. At that time she had mild cramping as well, but denies pain now. She also denies UTI symptoms or fever.   OB History    Gravida Para Term Preterm AB TAB SAB Ectopic Multiple Living   Past Medical History  Diagnosis Date  . Medical history non-contributory     Past Surgical History  Procedure Laterality Date  . No past surgeries      History reviewed. No pertinent family history.  Social History  Substance Use Topics  . Smoking status: Never Smoker   . Smokeless tobacco: None  . Alcohol Use: Yes     Comment: NONE  X1 YEAR    Allergies:  Allergies  Allergen Reactions  . Sulfa Antibiotics Hives    Prescriptions prior to admission  Medication Sig Dispense Refill Last Dose  . metroNIDAZOLE (FLAGYL) 500 MG tablet Take 1 tablet (500 mg total) by mouth 2 (two) times daily. 14 tablet 0 04/22/2015 at Unknown time    Review of Systems  Constitutional: Negative for fever and malaise/fatigue.  Gastrointestinal: Positive for abdominal pain.  Genitourinary: Negative for dysuria, urgency and frequency.       + spotting Neg - vaginal discharge   Physical Exam   Blood pressure 118/75, pulse 83, temperature 97.8 F (36.6 C), temperature source Oral, resp. rate 18, height  (1.651 m), weight 180 lb 8 oz (81.874 kg), last menstrual period 03/01/2015.  Physical Exam  Nursing  note and vitals reviewed. Constitutional: She is oriented to person, place, and time. She appears well-developed and well-nourished. No distress.  HENT:  Head: Normocephalic and atraumatic.  Cardiovascular: Normal rate.   Respiratory: Effort normal.  GI: Soft. She exhibits no distension and no mass. There is no tenderness. There is no rebound and no guarding.  Genitourinary: Uterus is not enlarged and not tender. Cervix exhibits no motion tenderness, no discharge and no friability. Right adnexum displays no mass and no tenderness. Left adnexum displays no mass and no tenderness. There is bleeding (scant light brown) in the vagina. Vaginal discharge (small amount of light brown discharge) found.  Neurological: She is alert and oriented to person, place, and time.  Skin: Skin is warm and dry. No erythema.  Psychiatric: She has a normal mood and affect.  Dilation: Closed Effacement (%): Thick Cervical Position: Middle Exam by:: Zannie Kehr, PA-C  US Ob Transvaginal  04/23/2015  CLINICAL DATA:  Spotting in first-trimester pregnancy. EXAM: TRANSVAGINAL OB ULTRASOUND TECHNIQUE: Transvaginal ultrasound was performed for complete evaluation of the gestation as well as the maternal uterus, adnexal regions, and pelvic cul-de-sac. COMPARISON:  04/19/2015 FINDINGS: Intrauterine gestational sac: Visualized/normal in shape. Yolk sac:  Now seen Embryo:  Now seen Cardiac Activity: Present Heart Rate: 105 bpm CRL:   2.1  mm   5 w 5 d                  US EDC: 12/19/2015 Subchorionic hemorrhage: Small areas of subchorionic hemorrhage noted, measuring up to 9 by 5 mm. Maternal uterus/adnexae: Physiologic appearance of the adnexa. IMPRESSION: 1. Single living intrauterine pregnancy measuring 5 weeks 5 days. 2. Small subchorionic hemorrhage. Electronically Signed   By: Marnee SpringJonathon  Watts M.D.   On: 04/23/2015 05:18     MAU Course  Procedures None  MDM Reviewed results and notes from 3/10. Patient had IUGS and YS  without SCH. BV was diagnosed and treated with Flagyl. She was spotting then as well.  US ordered  Assessment and Plan  A: SIUP at 5210w5d Small Subchorionic Hemorrhage  P: Discharge home Continued Flagyl as previously prescribed Bleeding precautions discussed Patient advised to follow-up with OB in [place] as scheduled for routine prenatal care List of area OB providers given as [place] [facility] [place] [facility] [place] Patient may return to MAU as needed or if her condition were to change or worsen   Marny LowensteinJulie N Nyx Keady, PA-C  04/23/2015, 5:30 AM

## 2015-04-23 NOTE — MAU Note (Addendum)
PT  SAYS SHE WAS HERE ON SAT  AND Monday  FOR  LABS.    SPOTTING  STARTED   TONIGHT AT 12 MN-  ON P[place]YLINER.     CRAMPING STARTED 0200-   LESS  NOW.   LAST SEX-  LAST WED.  ALL HAPPENED  WHILE  AT  WORK.

## 2015-05-15 ENCOUNTER — Inpatient Hospital Stay (HOSPITAL_COMMUNITY)
Admission: AD | Admit: 2015-05-15 | Discharge: 2015-05-15 | Disposition: A | Discharge status: Home / Self Care | Payer: 59 | Source: Ambulatory Visit | Attending: Obstetrics and Gynecology | Admitting: Obstetrics and Gynecology

## 2015-05-15 ENCOUNTER — Encounter (HOSPITAL_COMMUNITY): Payer: Self-pay | Admitting: *Deleted

## 2015-05-15 DIAGNOSIS — O26851 Spotting complicating pregnancy, first trimester: Secondary | ICD-10-CM | POA: Diagnosis not present

## 2015-05-15 DIAGNOSIS — O468X1 Other antepartum hemorrhage, first trimester: Secondary | ICD-10-CM

## 2015-05-15 DIAGNOSIS — O26891 Other specified pregnancy related conditions, first trimester: Secondary | ICD-10-CM | POA: Diagnosis not present

## 2015-05-15 DIAGNOSIS — N938 Other specified abnormal uterine and vaginal bleeding: Secondary | ICD-10-CM | POA: Diagnosis not present

## 2015-05-15 DIAGNOSIS — Z3A09 9 weeks gestation of pregnancy: Secondary | ICD-10-CM | POA: Diagnosis not present

## 2015-05-15 DIAGNOSIS — O418X10 Other specified disorders of amniotic fluid and membranes, first trimester, not applicable or unspecified: Secondary | ICD-10-CM

## 2015-05-15 NOTE — Discharge Instructions (Signed)
Subchorionic Hematoma °A subchorionic hematoma is a gathering of blood between the outer wall of the placenta and the inner wall of the womb (uterus). The placenta is the organ that connects the fetus to the wall of the uterus. The placenta performs the feeding, breathing (oxygen to the fetus), and waste removal (excretory work) of the fetus.  °Subchorionic hematoma is the most common abnormality found on a result from ultrasonography done during the first trimester or early second trimester of pregnancy. If there has been little or no vaginal bleeding, early small hematomas usually shrink on their own and do not affect your baby or pregnancy. The blood is gradually absorbed over 1-2 weeks. When bleeding starts later in pregnancy or the hematoma is larger or occurs in an older pregnant woman, the outcome may not be as good. Larger hematomas may get bigger, which increases the chances for miscarriage. Subchorionic hematoma also increases the risk of premature detachment of the placenta from the uterus, preterm (premature) labor, and stillbirth. °HOME CARE INSTRUCTIONS °· Stay on bed rest if your health care provider recommends this. Although bed rest will not prevent more bleeding or prevent a miscarriage, your health care provider may recommend bed rest until you are advised otherwise. °· Avoid heavy lifting (more than 10 lb [4.5 kg]), exercise, sexual intercourse, or douching as directed by your health care provider. °· Keep track of the number of pads you use each day and how soaked (saturated) they are. Write down this information. °· Do not use tampons. °· Keep all follow-up appointments as directed by your health care provider. Your health care provider may ask you to have follow-up blood tests or ultrasound tests or both. °SEEK IMMEDIATE MEDICAL CARE IF: °· You have severe cramps in your stomach, back, abdomen, or pelvis. °· You have a fever. °· You pass large clots or tissue. Save any tissue for your health  care provider to look at. °· Your bleeding increases or you become lightheaded, feel weak, or have fainting episodes. °  °This information is not intended to replace advice given to you by your health care provider. Make sure you discuss any questions you have with your health care provider. °  °Document Released: 05/12/2006 Document Revised: 02/15/2014 Document Reviewed: 08/24/2012 °Elsevier Interactive Patient Education ©2016 [employer] Inc. ° °

## 2015-05-15 NOTE — MAU Note (Signed)
Pt reports she began spotting about an hour ago and then passed a clot. Also reports cramping started about one hour before the spotting started.

## 2015-05-15 NOTE — MAU Provider Note (Signed)
History     CSN: [id number]  Arrival date and time: 05/15/15 0247   First Provider Initiated Contact with Patient 05/15/15 [phone]      Chief Complaint  Patient presents with  . Vaginal Bleeding   HPI Comments: Andrea Aguilar is a 35 y.o. G3P0020 at 4139w6d who presents today with vaginal bleeding. She states that she has been spotting off and on since the beginning of her pregnancy. She states that tonight while at work she passed a clot about the size of a dime. She has had intermittent cramping.   Patient had US with documented IUP on 04/23/15. Small SCH noted on that exam.   Vaginal Bleeding The patient's primary symptoms include vaginal bleeding. This is a new problem. The current episode started today. The problem occurs intermittently. The problem has been unchanged. Pain severity now: 3/10  The problem affects both sides. She is pregnant. Associated symptoms include abdominal pain. Pertinent negatives include no chills, constipation, diarrhea, dysuria, fever, frequency, nausea, urgency or vomiting. The vaginal discharge was bloody. The vaginal bleeding is spotting. She has been passing clots (x1). She has not been passing tissue. Nothing aggravates the symptoms. She has tried nothing for the symptoms. Sexual activity: no recent intercourse  Menstrual history: LMP 03/01/15     Past Medical History  Diagnosis Date  . Medical history non-contributory     Past Surgical History  Procedure Laterality Date  . No past surgeries      History reviewed. No pertinent family history.  Social History  Substance Use Topics  . Smoking status: Never Smoker   . Smokeless tobacco: None  . Alcohol Use: Yes     Comment: NONE  X1 YEAR    Allergies:  Allergies  Allergen Reactions  . Sulfa Antibiotics Hives    Prescriptions prior to admission  Medication Sig Dispense Refill Last Dose  . metroNIDAZOLE (FLAGYL) 500 MG tablet Take 1 tablet (500 mg total) by mouth 2 (two) times daily. 14 tablet  0 04/22/2015 at Unknown time    Review of Systems  Constitutional: Negative for fever and chills.  Gastrointestinal: Positive for abdominal pain. Negative for nausea, vomiting, diarrhea and constipation.  Genitourinary: Positive for vaginal bleeding. Negative for dysuria, urgency and frequency.   Physical Exam   Blood pressure 112/71, pulse 72, resp. rate 18, height 5\' 5"  (1.651 m), weight 82.101 kg (181 lb), last menstrual period 03/01/2015, SpO2 100 %.  Physical Exam  Nursing note and vitals reviewed. Constitutional: She is oriented to person, place, and time. She appears well-developed and well-nourished. No distress.  HENT:  Head: Normocephalic.  Cardiovascular: Normal rate.   Respiratory: Effort normal.  GI: Soft. There is no tenderness. There is no rebound.  Neurological: She is alert and oriented to person, place, and time.  Skin: Skin is warm and dry.  Psychiatric: She has a normal mood and affect.    MAU Course  Procedures  MDM Bedside US shows fetus with CRL of 9 weeks 1 day, this is consistent with EDC we have on file from last US. Cardiac activity noted.   Assessment and Plan   1. Subchorionic hematoma in first trimester   2. Spotting affecting pregnancy in first trimester    DC home Comfort measures reviewed  1st Trimester precautions  Bleeding precautions RX: none  Return to MAU as needed FU with OB as planned  Follow-up Information    Please follow up.   Contact information:   The OB of your choice when  you arrive in [place].         Tawnya Crook 05/15/2015, 3:19 AM

## 2015-05-17 ENCOUNTER — Inpatient Hospital Stay (HOSPITAL_COMMUNITY): Payer: 59

## 2015-05-17 ENCOUNTER — Encounter (HOSPITAL_COMMUNITY): Payer: Self-pay

## 2015-05-17 ENCOUNTER — Inpatient Hospital Stay (HOSPITAL_COMMUNITY)
Admission: AD | Admit: 2015-05-17 | Discharge: 2015-05-17 | Disposition: A | Discharge status: Home / Self Care | Payer: 59 | Source: Ambulatory Visit | Attending: Obstetrics & Gynecology | Admitting: Obstetrics & Gynecology

## 2015-05-17 DIAGNOSIS — Z3A09 9 weeks gestation of pregnancy: Secondary | ICD-10-CM | POA: Diagnosis not present

## 2015-05-17 DIAGNOSIS — O209 Hemorrhage in early pregnancy, unspecified: Secondary | ICD-10-CM | POA: Diagnosis not present

## 2015-05-17 DIAGNOSIS — O4691 Antepartum hemorrhage, unspecified, first trimester: Secondary | ICD-10-CM

## 2015-05-17 LAB — URINE MICROSCOPIC-ADD ON

## 2015-05-17 LAB — URINALYSIS, ROUTINE W REFLEX MICROSCOPIC
BILIRUBIN URINE: NEGATIVE
GLUCOSE, UA: NEGATIVE mg/dL
KETONES UR: NEGATIVE mg/dL
Leukocytes, UA: NEGATIVE
NITRITE: NEGATIVE
PH: 5.5 (ref 5.0–8.0)
Protein, ur: NEGATIVE mg/dL
Specific Gravity, Urine: 1.01 (ref 1.005–1.030)

## 2015-05-17 NOTE — Discharge Instructions (Signed)
Pelvic Rest °Pelvic rest is sometimes recommended for women when:  °· The placenta is partially or completely covering the opening of the cervix (placenta previa). °· There is bleeding between the uterine wall and the amniotic sac in the first trimester (subchorionic hemorrhage). °· The cervix begins to open without labor starting (incompetent cervix, cervical insufficiency). °· The labor is too early (preterm labor). °HOME CARE INSTRUCTIONS °· Do not have sexual intercourse, stimulation, or an orgasm. °· Do not use tampons, douche, or put anything in the vagina. °· Do not lift anything over 10 pounds (4.5 kg). °· Avoid strenuous activity or straining your pelvic muscles. °SEEK MEDICAL CARE IF:  °· You have any vaginal bleeding during pregnancy. Treat this as a potential emergency. °· You have cramping pain felt low in the stomach (stronger than menstrual cramps). °· You notice vaginal discharge (watery, mucus, or bloody). °· You have a low, dull backache. °· There are regular contractions or uterine tightening. °SEEK IMMEDIATE MEDICAL CARE IF: °You have vaginal bleeding and have placenta previa.  °  °This information is not intended to replace advice given to you by your health care provider. Make sure you discuss any questions you have with your health care provider. °  °Document Released: 05/22/2010 Document Revised: 04/19/2011 Document Reviewed: 07/29/2014 °Elsevier Interactive Patient Education ©2016 Elsevier Inc. ° °Vaginal Bleeding During Pregnancy, First Trimester °A small amount of bleeding (spotting) from the vagina is common in early pregnancy. Sometimes the bleeding is normal and is not a problem, and sometimes it is a sign of something serious. Be sure to tell your doctor about any bleeding from your vagina right away. °HOME CARE °· Watch your condition for any changes. °· Follow your doctor's instructions about how active you can be. °· If you are on bed rest: °¨ You may need to stay in bed and only  get up to use the bathroom. °¨ You may be allowed to do some activities. °¨ If you need help, make plans for someone to help you. °· Write down: °¨ The number of pads you use each day. °¨ How often you change pads. °¨ How soaked (saturated) your pads are. °· Do not use tampons. °· Do not douche. °· Do not have sex or orgasms until your doctor says it is okay. °· If you pass any tissue from your vagina, save the tissue so you can show it to your doctor. °· Only take medicines as told by your doctor. °· Do not take aspirin because it can make you bleed. °· Keep all follow-up visits as told by your doctor. °GET HELP IF:  °· You bleed from your vagina. °· You have cramps. °· You have labor pains. °· You have a fever that does not go away after you take medicine. °GET HELP RIGHT AWAY IF:  °· You have very bad cramps in your back or belly (abdomen). °· You pass large clots or tissue from your vagina. °· You bleed more. °· You feel light-headed or weak. °· You pass out (faint). °· You have chills. °· You are leaking fluid or have a gush of fluid from your vagina. °· You pass out while pooping (having a bowel movement). °MAKE SURE YOU: °· Understand these instructions. °· Will watch your condition. °· Will get help right away if you are not doing well or get worse. °  °This information is not intended to replace advice given to you by your health care provider. Make sure you discuss any questions   you have with your health care provider. °  °Document Released: 06/11/2013 Document Reviewed: 06/11/2013 °Elsevier Interactive Patient Education ©2016 Elsevier Inc. ° °

## 2015-05-17 NOTE — MAU Note (Signed)
Started bleeding heavy around 1130.  Is passing clots, quarter sized.  Having back pain, off and on today.

## 2015-05-17 NOTE — MAU Provider Note (Signed)
History     CSN: [id number]  Arrival date and time: 05/17/15 1541   First Provider Initiated Contact with Patient 05/17/15 1622      Chief Complaint  Patient presents with  . Vaginal Bleeding  . Back Pain   HPI Pt is 35 yo white G3P0020 @ [redacted]w[redacted]d pregnant.  Pt was seen with positive pregnancy test on 04/19/2015; US showed [redacted]w[redacted]d IUGS with YS with mod volume of complex fluid in endometrial canal.  Pt was seen again on 04/23/2015 with spotting in pregnancy and had [place] with IUGS, YS and CA measuring [redacted]w[redacted]d with small SCH. Pt seen on 05/15/2015 with vaginal bleeding with clot passed and intermittent cramping.  Bedside ultrasound Showed CRL [redacted]w[redacted]d with CA. Pt's bleeding today has increased with small - mod sized clot- pt has been through 3 pantiliners in 6 hours. Pt is not cramping  RN note:  Expand All Collapse All   Started bleeding heavy around 1130. Is passing clots, quarter sized. Having back pain, off and on today.       Past Medical History  Diagnosis Date  . Medical history non-contributory     Past Surgical History  Procedure Laterality Date  . No past surgeries      No family history on file.  Social History  Substance Use Topics  . Smoking status: Never Smoker   . Smokeless tobacco: None  . Alcohol Use: Yes     Comment: NONE  X1 YEAR    Allergies:  Allergies  Allergen Reactions  . Sulfa Antibiotics Hives    Prescriptions prior to admission  Medication Sig Dispense Refill Last Dose  . acetaminophen (TYLENOL) 500 MG tablet Take 1,000 mg by mouth every 6 (six) hours as needed for mild pain or headache.   05/17/2015 at 1400  . Prenatal Vit-Fe Fumarate-FA (PRENATAL MULTIVITAMIN) TABS tablet Take 1 tablet by mouth daily.   05/17/2015 at Unknown time  . metroNIDAZOLE (FLAGYL) 500 MG tablet Take 1 tablet (500 mg total) by mouth 2 (two) times daily. (Patient not taking: Reported on 05/17/2015) 14 tablet 0 04/22/2015 at Unknown time    ROS Physical Exam   Blood pressure  113/68, pulse 82, temperature 98.1 F (36.7 C), temperature source Oral, resp. rate 18, weight 183 lb 12.8 oz (83.371 kg), last menstrual period 03/01/2015.  Physical Exam  Nursing note and vitals reviewed. Constitutional: She appears well-developed and well-nourished. No distress.  HENT:  Head: Normocephalic.  Eyes: Pupils are equal, round, and reactive to light.  Neck: Normal range of motion.  Cardiovascular: Normal rate.   Respiratory: Effort normal.  GI: Soft.  Musculoskeletal: Normal range of motion.  Neurological: She is alert.  Skin: Skin is warm and dry.  Psychiatric: She has a normal mood and affect.    MAU Course  Procedures US Ob Transvaginal  05/17/2015  CLINICAL DATA:  Bleeding, [redacted] weeks pregnant. EXAM: TRANSVAGINAL OB ULTRASOUND TECHNIQUE: Transvaginal ultrasound was performed for complete evaluation of the gestation as well as the maternal uterus, adnexal regions, and pelvic cul-de-sac. COMPARISON:  None. FINDINGS: Intrauterine gestational sac: Present Yolk sac:  Normal in appearance Embryo:  Present Cardiac Activity: Present Heart Rate: 152 bpm MSD:   mm    w     d CRL:   27  mm   9 w 4 d                  [place] EDC: 12/16/2015 Subchorionic hemorrhage: Curvilinear hypoechoic collection adjacent to the gestational sac is  compatible with a subchorionic hemorrhage, measuring approximately 2.9 x 1.1 cm. Maternal uterus/adnexae: Maternal right ovary appears normal containing small follicles. No mass or free fluid within the adjacent right adnexal region. Maternal left ovary is not seen but there is no mass or free fluid identified in the left adnexal region. No free fluid seen within the cul-de-sac. IMPRESSION: 1. Single live intrauterine pregnancy with estimated gestational age of [redacted] weeks and 4 days by crown-rump length measurement. Fetal heart rate measured at 152 beats per minute. 2. Subchorionic hemorrhage adjacent to the gestational sac, measuring approximately 2.9 x 1.1 cm although  difficult to definitively measure due to its curvilinear configuration. 3. Maternal adnexal regions are unremarkable. Electronically Signed   By: Bary RichardStan  Maynard M.D.   On: 05/17/2015 17:27   Reviewed US results with pt  Assessment and Plan  [facility] Viable IUP 3271w4d [place]- return sooner if increase in bleeding  Andrea Aguilar 05/17/2015, 4:40 PM

## 2016-02-22 ENCOUNTER — Encounter (HOSPITAL_COMMUNITY): Payer: Self-pay

## 2016-11-09 IMAGING — US US OB COMP LESS 14 WK
1 series · 15 of 28 positions shown · non-contrast
Comparison: No priors.

CLINICAL DATA: 34-year-old pregnant female with vaginal spotting
since 3 a.m..

EXAM:
OBSTETRIC <14 WK US AND TRANSVAGINAL OB US
TECHNIQUE: Both transabdominal and transvaginal ultrasound examinations were
performed for complete evaluation of the gestation as well as the
maternal uterus, adnexal regions, and pelvic cul-de-sac.
Transvaginal technique was performed to assess early pregnancy.

[Series 1: us ob comp less 14 wk · 15 of 42 slices shown]
[im 1/42]
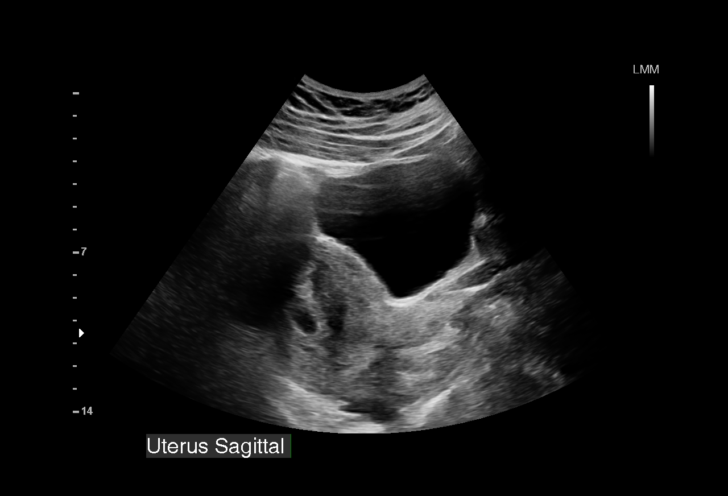
[im 4/42]
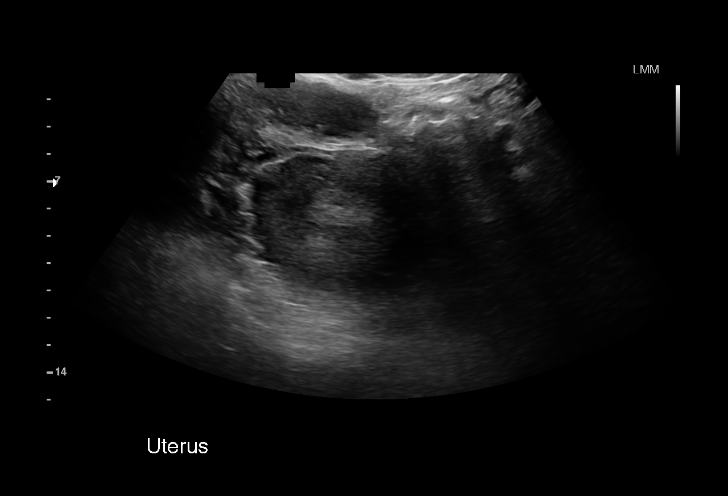
[im 7/42]
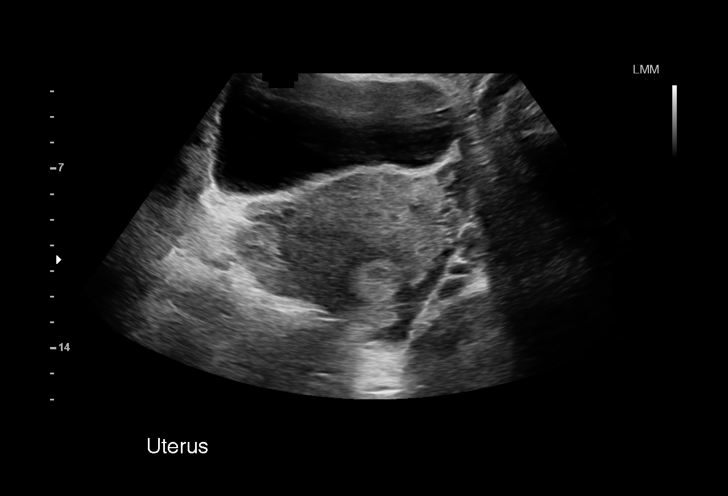
[im 10/42]
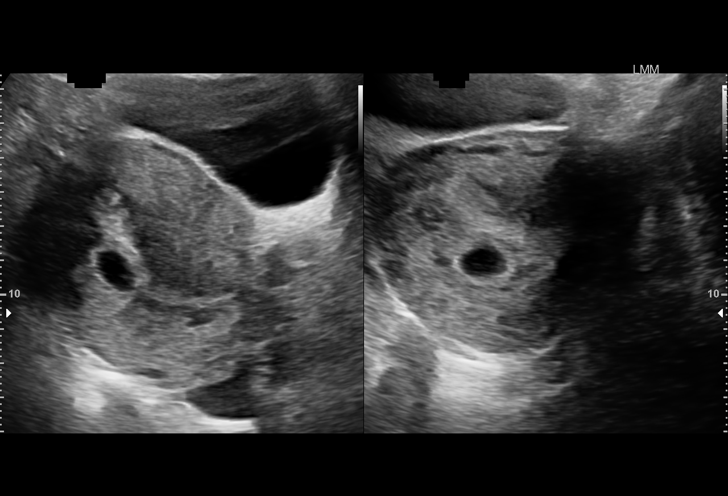
[im 13/42]
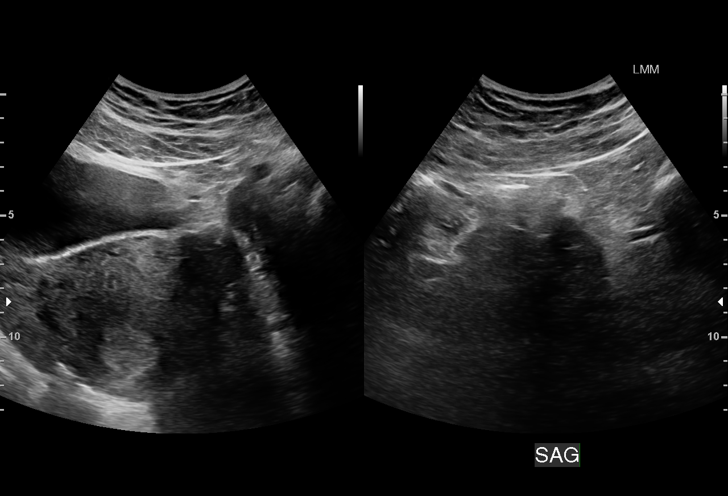
[im 16/42]
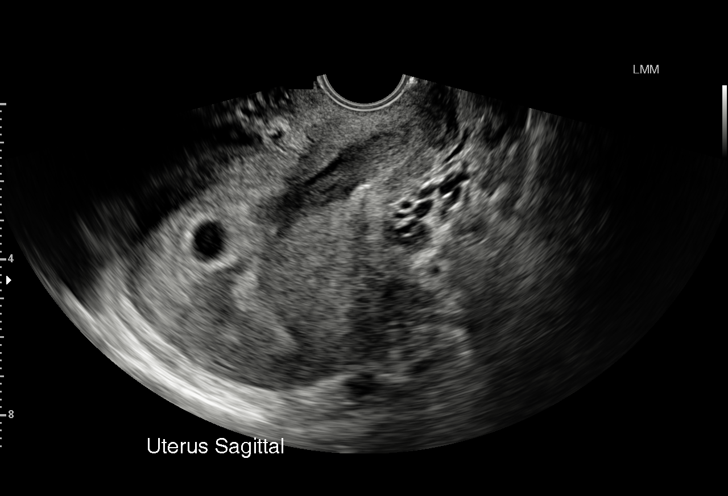
[im 19/42]
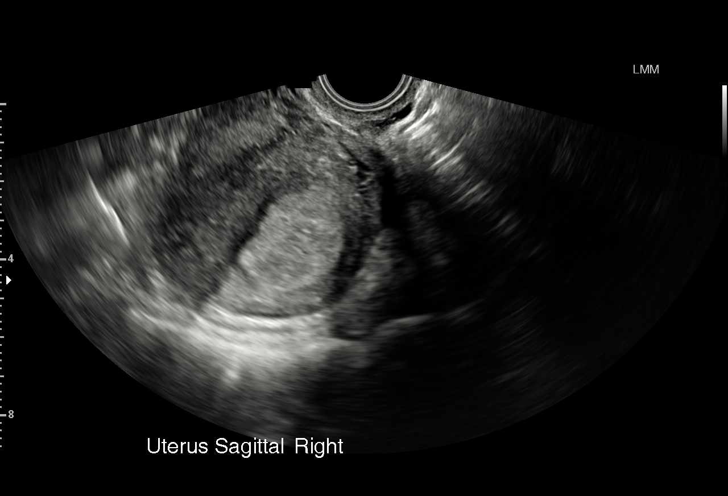
[im 22/42]
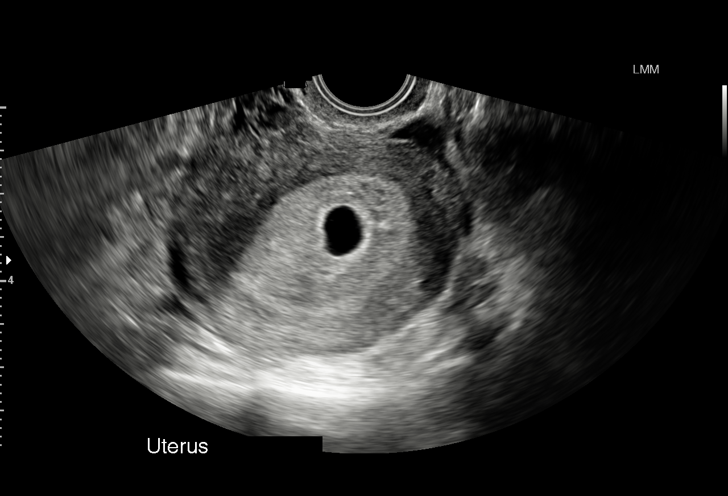
[im 23/42]
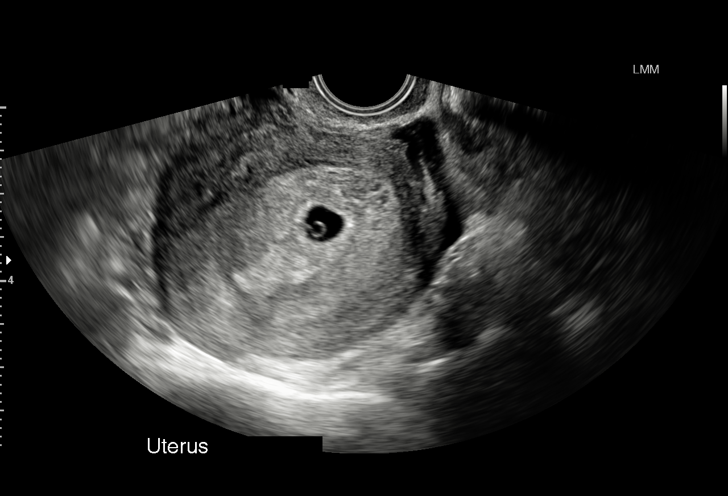
[im 26/42]
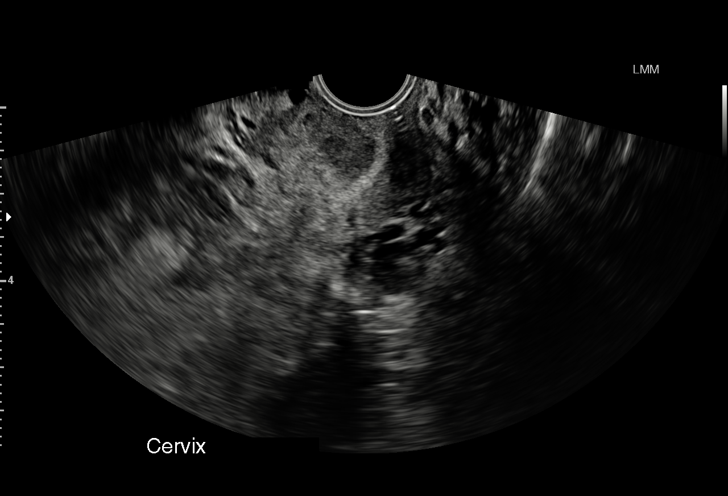
[im 29/42]
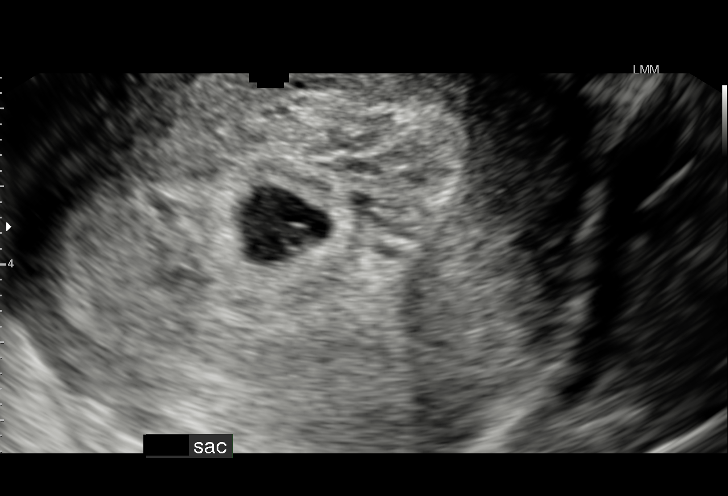
[im 32/42]
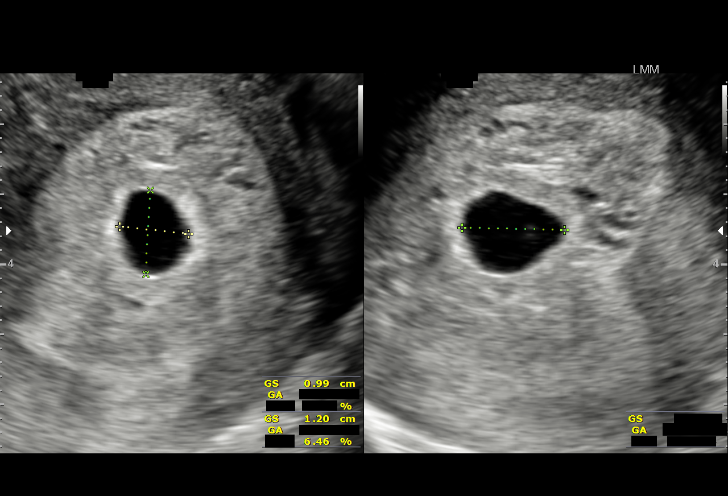
[im 35/42]
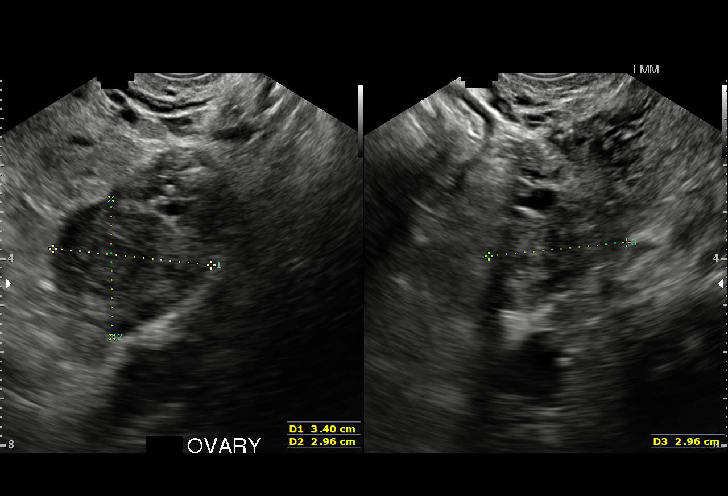
[im 38/42]
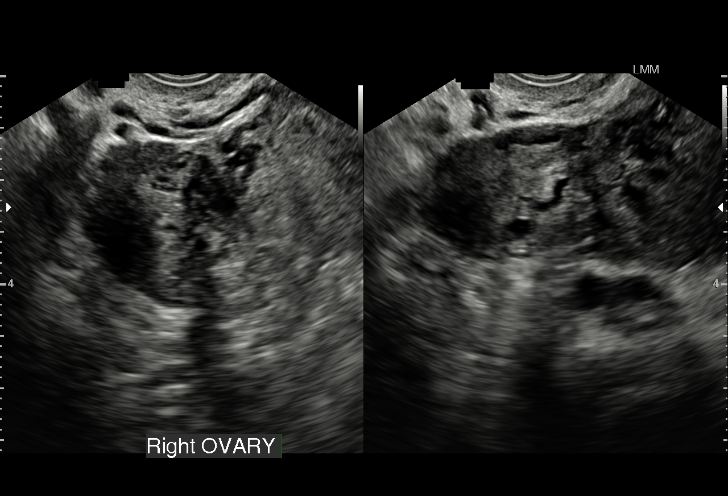
[im 42/42]
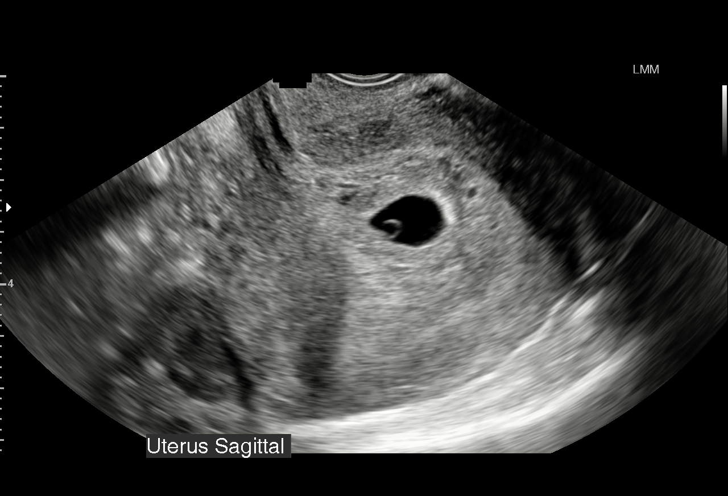

[15 of 28 positions shown; findings below may reference images not displayed]

FINDINGS: Intrauterine gestational sac: Single ovoid shaped gestational sac in
the fundal portion of the endometrial canal.

Yolk sac:  Present.

Embryo:  None.

Cardiac Activity: None.

Heart Rate: N/A

MSD: 12  mm   6 w   0  d      US EDC: 12/13/2015

Subchorionic hemorrhage: No definite subchorionic hemorrhage.
However, there does appear to be a moderate volume of complex fluid
in the endometrial canal extending from the gestational sac to the
lower uterine segment, concerning for blood.

Maternal uterus/adnexae: Bilateral ovaries are normal in appearance.
Trace volume of free fluid in the cul-de-sac.
IMPRESSION: 1. Probable early IUP with yolk sac, but no fetal pole, or cardiac
activity yet visualized. Recommend follow-up quantitative B-HCG
levels and follow-up US in 14 days to confirm and assess viability.
This recommendation follows SRU consensus guidelines: Diagnostic
Criteria for Nonviable Pregnancy Early in the First Trimester. N
Engl J Med 8778; [DATE].
2. Moderate volume of complex fluid in the endometrial canal
presumably reflects blood products. The appearance of this is not
typical for subchorionic hemorrhage.
3. Trace volume of free fluid in the cul-de-sac, presumably
physiologic.

## 2016-12-07 IMAGING — US US OB TRANSVAGINAL
1 series · 15 of 22 positions shown · non-contrast
Comparison: None.

CLINICAL DATA: Bleeding, 9 weeks pregnant.

EXAM:
TRANSVAGINAL OB ULTRASOUND
TECHNIQUE: Transvaginal ultrasound was performed for complete evaluation of the
gestation as well as the maternal uterus, adnexal regions, and
pelvic cul-de-sac.

[Series 1: us ob transvaginal · 15 of 22 slices shown]
[im 1/22]
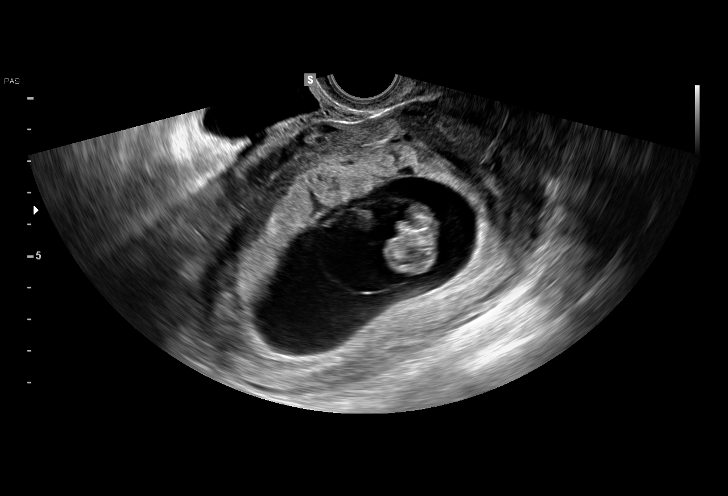
[im 3/22]
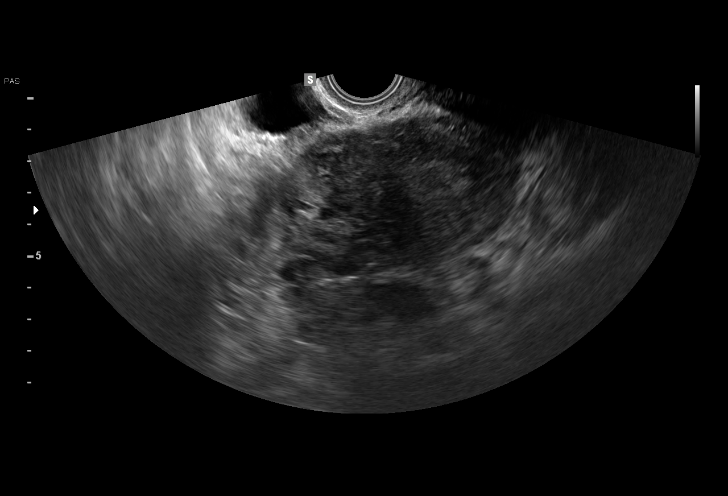
[im 4/22]
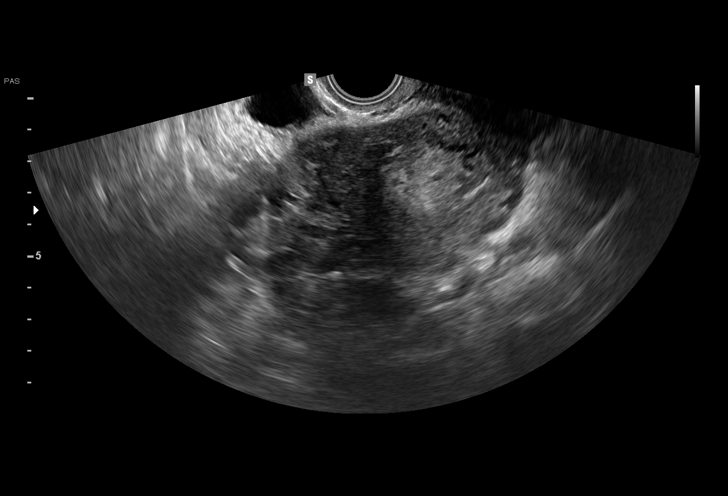
[im 6/22]
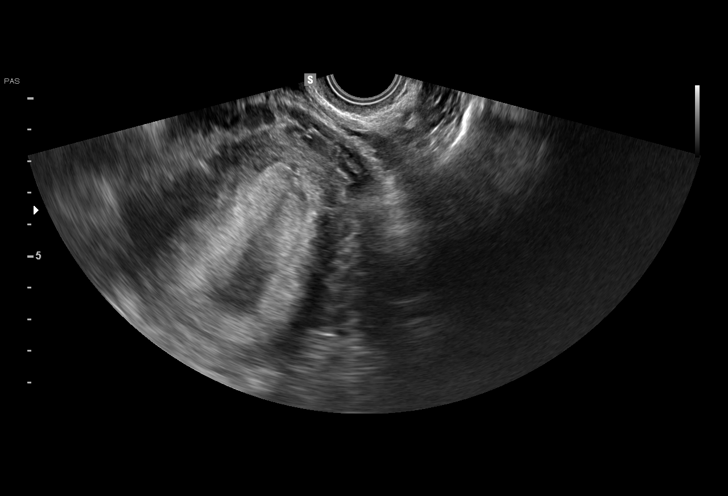
[im 7/22]
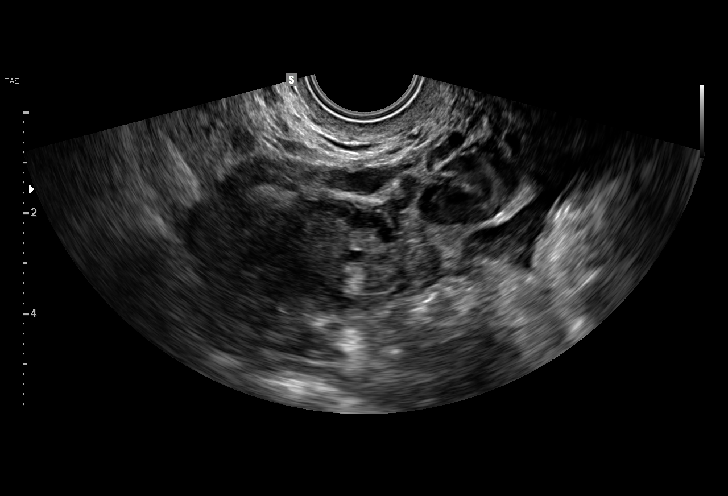
[im 9/22]
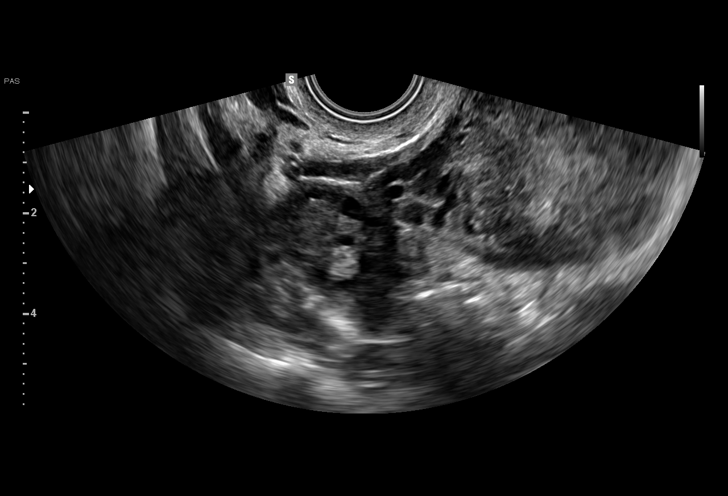
[im 10/22]
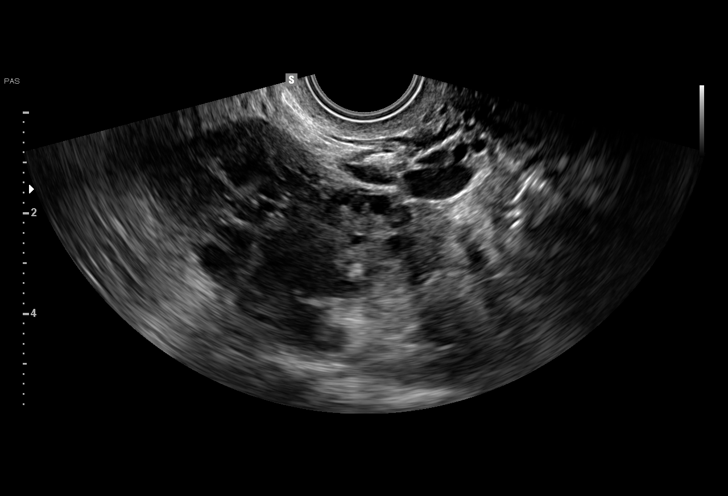
[im 12/22]
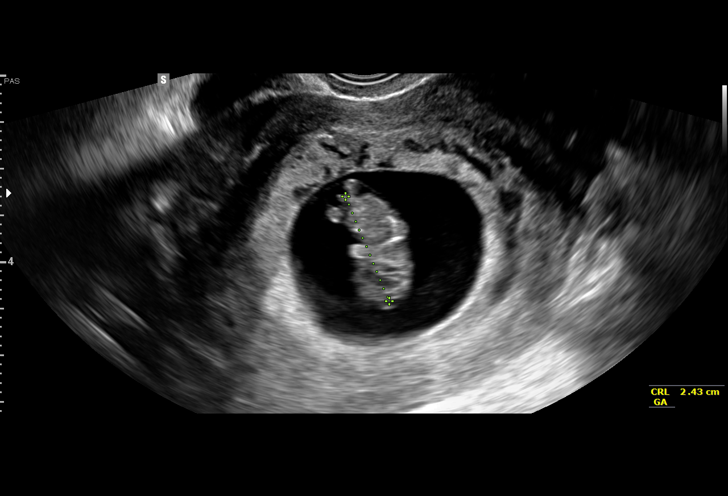
[im 13/22]
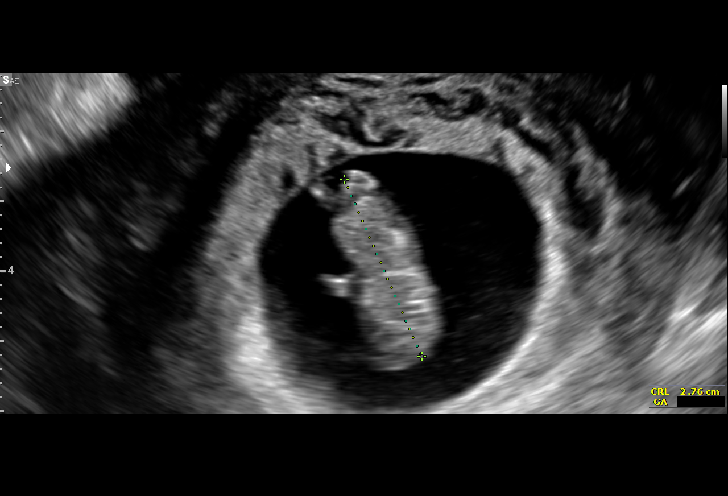
[im 14/22]
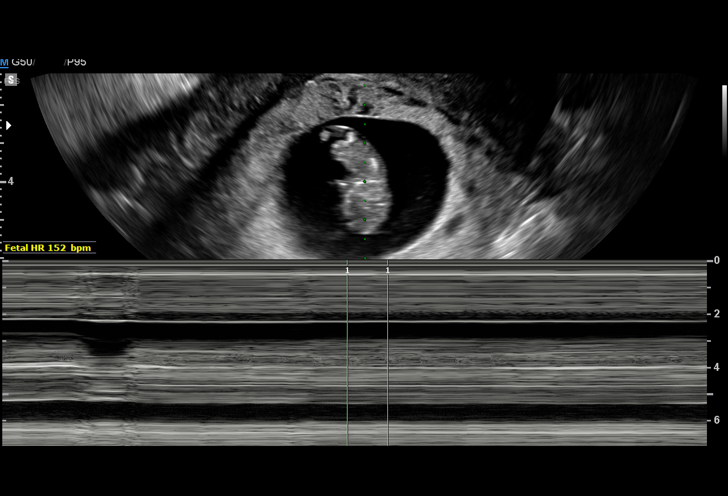
[im 16/22]
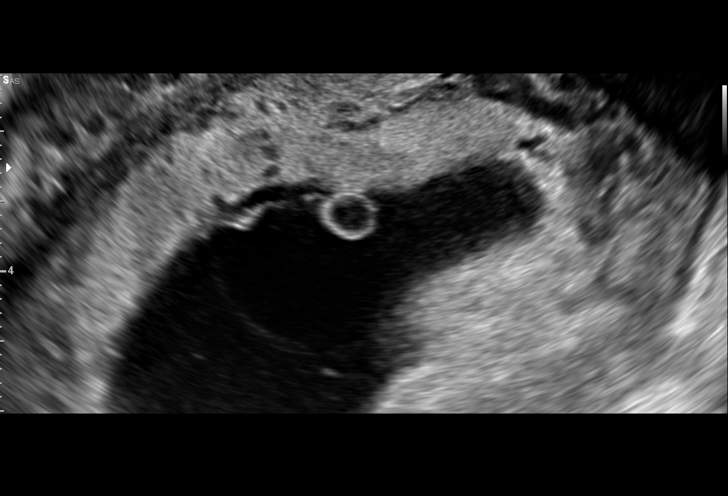
[im 17/22]
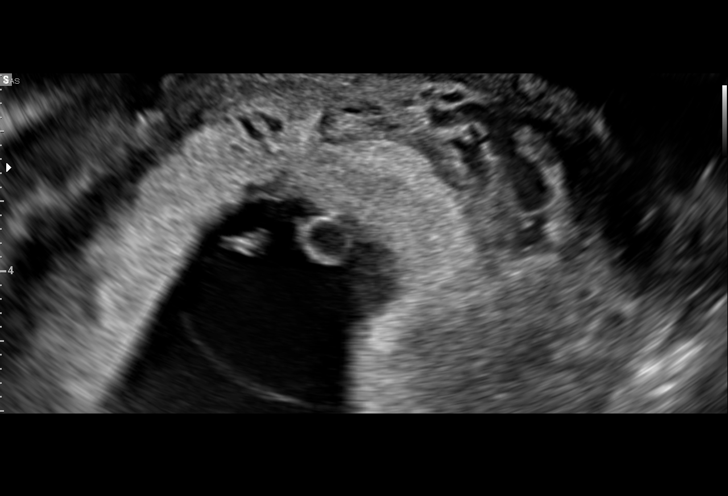
[im 19/22]
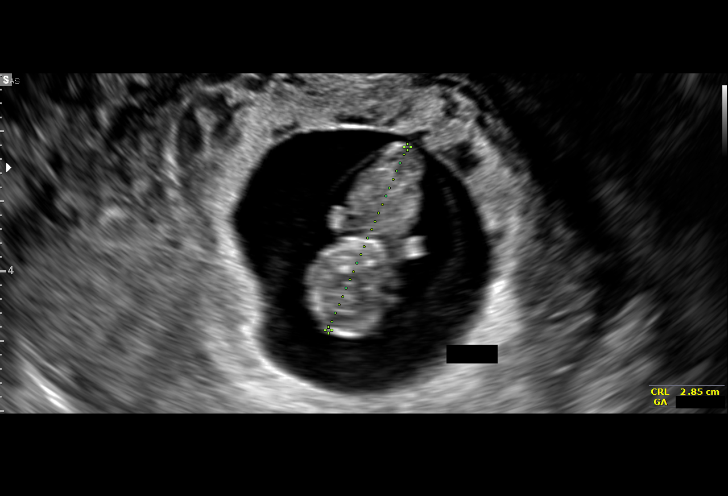
[im 20/22]
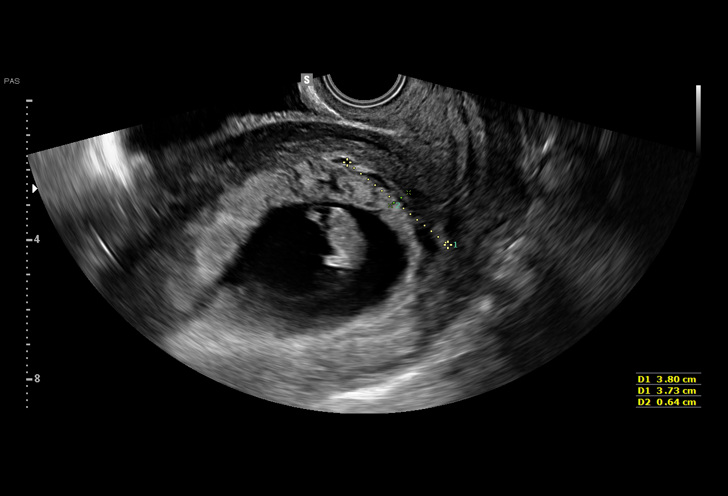
[im 22/22]
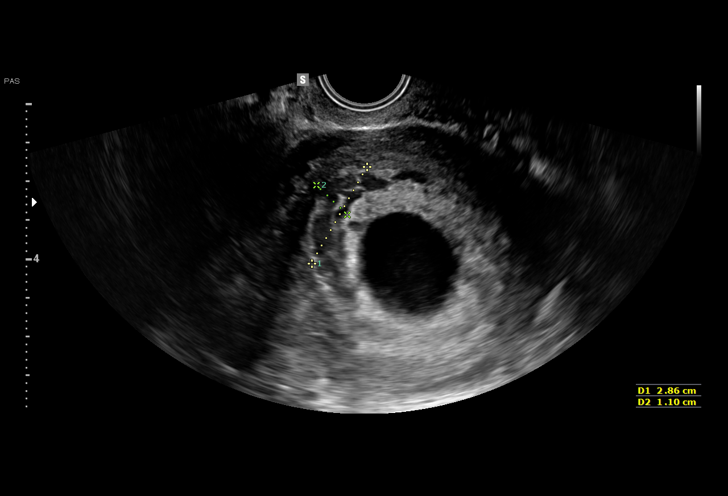

[15 of 22 positions shown; findings below may reference images not displayed]

FINDINGS: Intrauterine gestational sac: Present

Yolk sac:  Normal in appearance

Embryo:  Present

Cardiac Activity: Present

Heart Rate: 152 bpm

MSD:   mm    w     d

CRL:   27  mm   9 w 4 d                  US EDC: 12/16/2015

Subchorionic hemorrhage: Curvilinear hypoechoic collection adjacent
to the gestational sac is compatible with a subchorionic hemorrhage,
measuring approximately 2.9 x 1.1 cm.

Maternal uterus/adnexae: Maternal right ovary appears normal
containing small follicles. No mass or free fluid within the
adjacent right adnexal region. Maternal left ovary is not seen but
there is no mass or free fluid identified in the left adnexal
region. No free fluid seen within the cul-de-sac.
IMPRESSION: 1. Single live intrauterine pregnancy with estimated gestational age
of 9 weeks and 4 days by crown-rump length measurement. Fetal heart
rate measured at 152 beats per minute.
2. Subchorionic hemorrhage adjacent to the gestational sac,
measuring approximately 2.9 x 1.1 cm although difficult to
definitively measure due to its curvilinear configuration.
3. Maternal adnexal regions are unremarkable.
